# Patient Record
Sex: Male | Born: 1990 | Race: Black or African American | Hispanic: No | Marital: Single | Smoking: Never smoker
Health system: Southern US, Community
[De-identification: ages and names within clinical notes are randomized; demographics above are authoritative.]

## PROBLEM LIST (undated history)

## (undated) HISTORY — PX: LEG SURGERY: SHX1003

---
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
## 2013-01-21 ENCOUNTER — Emergency Department (HOSPITAL_COMMUNITY)
Admission: EM | Admit: 2013-01-21 | Discharge: 2013-01-21 | Disposition: A | Discharge status: Home / Self Care | Payer: Medicaid Other | Attending: Emergency Medicine | Admitting: Emergency Medicine

## 2013-01-21 ENCOUNTER — Emergency Department (HOSPITAL_COMMUNITY): Payer: Medicaid Other

## 2013-01-21 ENCOUNTER — Encounter (HOSPITAL_COMMUNITY): Payer: Self-pay | Admitting: Emergency Medicine

## 2013-01-21 DIAGNOSIS — J45909 Unspecified asthma, uncomplicated: Secondary | ICD-10-CM | POA: Insufficient documentation

## 2013-01-21 DIAGNOSIS — R05 Cough: Secondary | ICD-10-CM | POA: Insufficient documentation

## 2013-01-21 DIAGNOSIS — R059 Cough, unspecified: Secondary | ICD-10-CM

## 2013-01-21 DIAGNOSIS — F17200 Nicotine dependence, unspecified, uncomplicated: Secondary | ICD-10-CM | POA: Insufficient documentation

## 2013-01-21 DIAGNOSIS — M79642 Pain in left hand: Secondary | ICD-10-CM

## 2013-01-21 DIAGNOSIS — M79609 Pain in unspecified limb: Secondary | ICD-10-CM | POA: Insufficient documentation

## 2013-01-21 MED ORDER — ALBUTEROL SULFATE HFA 108 (90 BASE) MCG/ACT IN AERS
2.0000 | INHALATION_SPRAY | RESPIRATORY_TRACT | Status: DC | PRN
  Administered 2013-01-21: 2 via RESPIRATORY_TRACT
  Filled 2013-01-21 (×2): qty 6.7

## 2013-01-21 MED ORDER — GUAIFENESIN 100 MG/5ML PO LIQD: 100.0000 mg | ORAL | Status: DC | PRN

## 2013-01-21 NOTE — ED Provider Notes (Signed)
CSN: [id number]     Arrival date & time 01/21/13  1544 History   First MD Initiated Contact with Patient 01/21/13 1633     Chief Complaint  Patient presents with  . Cough   (Consider location/radiation/quality/duration/timing/severity/associated sxs/prior Treatment) HPI Comments: Patient presents emergency department with chief complaint of cough x3 months. He states that for the past couple of days, he has had worsening pain with inspiration. He denies any fevers, night sweats, or vomiting. Denies any sick contacts. He has not tried taking anything for the cough. He endorses a history of asthma. Denies any recent surgery or travel. Additionally, patient states that he has had left hand pain x2 weeks. States the pain is worsened when he holds his hand in a certain position, and when he is texting or playing video games.  The history is provided by the patient. No language interpreter was used.    Past Medical History  Diagnosis Date  . Asthma    Past Surgical History  Procedure Laterality Date  . Leg surgery    . Hardware removal  08/13/2011    Procedure: HARDWARE REMOVAL;  Surgeon: Kathryne Hitch, MD;  Location: [facility] OR;  Service: Orthopedics;  Laterality: Right;  Removal of distal interlocking screws right tibial nail   History reviewed. No pertinent family history. History  Substance Use Topics  . Smoking status: Current Every Day Smoker  . Smokeless tobacco: Not on file  . Alcohol Use: No    Review of Systems  All other systems reviewed and are negative.    Allergies  Review of patient's allergies indicates no known allergies.  Home Medications  No current outpatient prescriptions on file. BP 116/52  Pulse 78  Temp(Src) 98.2 F (36.8 C) (Oral)  Resp 18  SpO2 98% Physical Exam  Nursing note and vitals reviewed. Constitutional: He is oriented to person, place, and time. He appears well-developed and well-nourished.  HENT:  Head: Normocephalic and atraumatic.   Eyes: EOM are normal.  Neck: Normal range of motion. Neck supple.  Cardiovascular: Normal rate, regular rhythm, normal heart sounds and intact distal pulses.  Exam reveals no gallop and no friction rub.   No murmur heard. Pulmonary/Chest: Effort normal and breath sounds normal. No respiratory distress. He has no wheezes. He has no rales. He exhibits no tenderness.  Abdominal: Soft. He exhibits no distension.  Musculoskeletal: Normal range of motion. He exhibits no edema and no tenderness.  Left hand range of motion strength 5/5, negative Tinel, negative Phalen, no bony tenderness of the left hand or wrist  Neurological: He is alert and oriented to person, place, and time.  Sensation and strength intact throughout  Skin: Skin is warm and dry.  No erythema, or signs of cellulitis  Psychiatric: He has a normal mood and affect. His behavior is normal. Judgment and thought content normal.    ED Course  Procedures (including critical care time) Labs Review Labs Reviewed - No data to display Imaging Review Dg Chest 2 View  01/21/2013   CLINICAL DATA:  Coughing  EXAM: CHEST  2 VIEW  COMPARISON:  None.  FINDINGS: Lungs are clear. Heart size and pulmonary vascularity are normal. No adenopathy. No bone lesions.  IMPRESSION: No abnormality noted.   Electronically Signed   By: Bretta Bang M.D.   On: 01/21/2013 16:15    EKG Interpretation   None       MDM   1. Cough   2. Hand pain, left  Patient with cough x3 months. No fever, no hemoptysis, no night sweats. Pain is worsened with inspiration which is new. Chest x-ray is negative. PERC negative. Low risk for ACS. Patient is well-appearing, not in any apparent distress. Suspect intercostal strain. Recommend ibuprofen, and will give cough medicine.  Patient also has left hand pain, however no bony tenderness, no signs of infection, normal range of motion and strength, sensation intact, and good pulses. Given patient a wrist brace,  advised him against doing activity that hurts him, recommend him followup if symptoms do not improve in one week.   Roxy Horseman, PA-C 01/21/13 [phone]

## 2013-01-21 NOTE — ED Provider Notes (Signed)
  Medical screening examination/treatment/procedure(s) were performed by non-physician practitioner and as supervising physician I was immediately available for consultation/collaboration.  EKG Interpretation   None          Gerhard Munch, MD 01/21/13 1930

## 2013-01-21 NOTE — ED Notes (Signed)
Pt c/o cough x 3 months and pain with cough and inspiration x 1 week; pt sts when he holds his cell phone he has pain in left hand x 2 weeks

## 2014-02-27 ENCOUNTER — Emergency Department (HOSPITAL_COMMUNITY)
Admission: EM | Admit: 2014-02-27 | Discharge: 2014-02-27 | Disposition: A | Discharge status: Home / Self Care | Payer: Self-pay | Attending: Emergency Medicine | Admitting: Emergency Medicine

## 2014-02-27 ENCOUNTER — Encounter (HOSPITAL_COMMUNITY): Payer: Self-pay | Admitting: *Deleted

## 2014-02-27 DIAGNOSIS — K122 Cellulitis and abscess of mouth: Secondary | ICD-10-CM

## 2014-02-27 MED ORDER — IBUPROFEN 800 MG PO TABS: 800.0000 mg | ORAL_TABLET | Freq: Three times a day (TID) | ORAL | Status: DC | PRN

## 2014-02-27 MED ORDER — KETOROLAC TROMETHAMINE 30 MG/ML IJ SOLN
30.0000 mg | Freq: Once | INTRAMUSCULAR | Status: AC
  Administered 2014-02-27: 30 mg via INTRAVENOUS
  Filled 2014-02-27: qty 1

## 2014-02-27 MED ORDER — SODIUM CHLORIDE 0.9 % IV BOLUS (SEPSIS)
1000.0000 mL | Freq: Once | INTRAVENOUS | Status: AC
  Administered 2014-02-27: 1000 mL via INTRAVENOUS

## 2014-02-27 MED ORDER — SODIUM CHLORIDE 0.9 % IV SOLN
3.0000 g | Freq: Once | INTRAVENOUS | Status: AC
  Administered 2014-02-27: 3 g via INTRAVENOUS
  Filled 2014-02-27: qty 3

## 2014-02-27 MED ORDER — AMOXICILLIN-POT CLAVULANATE 875-125 MG PO TABS: 1.0000 | ORAL_TABLET | Freq: Two times a day (BID) | ORAL | Status: DC

## 2014-02-27 MED ORDER — DEXAMETHASONE SODIUM PHOSPHATE 10 MG/ML IJ SOLN
10.0000 mg | Freq: Once | INTRAMUSCULAR | Status: AC
  Administered 2014-02-27: 10 mg via INTRAVENOUS
  Filled 2014-02-27: qty 1

## 2014-02-27 MED ORDER — METHYLPREDNISOLONE (PAK) 4 MG PO TABS: ORAL_TABLET | ORAL | Status: DC

## 2014-02-27 NOTE — Discharge Instructions (Signed)
Uvulitis  Uvulitis is redness and soreness (inflammation) of the uvula. The uvula is the small tongue-shaped piece of tissue in the back of your mouth.   CAUSES  Infection is a common cause of uvulitis. Infection of the uvula can be either viral or bacterial. Infectious uvulitis usually only occurs in association with another condition, such as inflammation and infection of the mouth or throat.   Other causes of uvulitis include:  · Trauma to the uvula.  · Swelling from excess fluid buildup (edema), which may be an allergic reaction.  · Inhalation of irritants, such as chemical agents, smoke, or steam.  DIAGNOSIS  Your caregiver can usually diagnose uvulitis through a physical examination. Bacterial uvulitis can be diagnosed through the results of the growth of samples of bodily substances taken from your mouth (cultures).  HOME CARE INSTRUCTIONS   · Rest as much as possible.  · Young children may suck on frozen juice bars or frozen ice pops. Older children and adults may gargle with a warm or cold liquid to help soothe the throat. (Mix ¼ tsp of salt in 8 oz of water, or use strong tea.)  · Use a cool-mist humidifier to lessen throat irritation and cough.  · Drink enough fluids to keep your urine clear or pale yellow.  · While the throat is very sore, eat soft or liquid foods such as milk, ice cream, soups, or milk drinks.  · Family members who develop a sore throat or fever should have a medical exam or throat culture.  · If your child has uvulitis and is taking antibiotic medicine, wait 24 hours or until his or her temperature is near normal (less than 100° F [37.8° C]) before allowing him or her to return to school or day care.  · Only take over-the-counter or prescription medicines for pain, discomfort, or fever as directed by your caregiver.  Ask when your test results will be ready. Make sure you get your test results.   SEEK MEDICAL CARE IF:   · You have an oral temperature above 102° F (38.9° C).  · You  develop large, tender lumps your the neck.  · Your child develops a rash.  · You cough up green, yellow-brown, or bloody substances.  SEEK IMMEDIATE MEDICAL CARE IF:   · You develop any new symptoms, such as vomiting, earache, severe headache, stiff neck, chest pain, or trouble breathing or swallowing.  · Your airway is blocked.  · You develop more severe throat pain along with drooling or voice changes.  Document Released: 09/08/2003 Document Revised: 04/22/2011 Document Reviewed: 04/05/2010  [employer]® Patient Information ©2015 [employer]. This information is not intended to replace advice given to you by your health care provider. Make sure you discuss any questions you have with your health care provider.

## 2014-02-27 NOTE — ED Notes (Signed)
Awoke feeling like something in his throat was swollen. Throat is uncomfortable. No history of anaphylaxis

## 2014-02-27 NOTE — ED Provider Notes (Signed)
TIME SEEN: 8:50 AM  CHIEF COMPLAINT: Swollen uvula  HPI: Pt is a 24 y.o. male who presents emergency department with a swollen uvula when he woke up this morning. Denies that his throat is hurting him. Denies any fevers or chills. No difficulty swallowing, speaking or breathing. He states he just feels uncomfortable when he breathes or his mouth because he can feel his uvula moving. No history of any new exposures, new foods or medications. No tongue or lip swelling. No dental pain. No sick contacts.  ROS: See HPI Constitutional: no fever  Eyes: no drainage  ENT: no runny nose   Cardiovascular:  no chest pain  Resp: no SOB  GI: no vomiting GU: no dysuria Integumentary: no rash  Allergy: no hives  Musculoskeletal: no leg swelling  Neurological: no slurred speech ROS otherwise negative  PAST MEDICAL HISTORY/PAST SURGICAL HISTORY:  No past medical history on file.  MEDICATIONS:  Prior to Admission medications   Not on File    ALLERGIES:  Allergies not on file  SOCIAL HISTORY:  History  Substance Use Topics  . Smoking status: Not on file  . Smokeless tobacco: Not on file  . Alcohol Use: Not on file    FAMILY HISTORY: No family history on file.  EXAM: BP 135/71 mmHg  Pulse 96  Temp(Src) 98.6 F (37 C) (Oral)  Resp 20  SpO2 99% CONSTITUTIONAL: Alert and oriented and responds appropriately to questions. Well-appearing; well-nourished, nontoxic-appearing HEAD: Normocephalic EYES: Conjunctivae clear, PERRL ENT: normal nose; no rhinorrhea; moist mucous membranes; no dental caries are abscess, no oral pharyngeal erythema, no tonsillar hypertrophy or exudate, tonsils are extremely small, patient does have use either swelling but has a very wide and open and patent airway, no trismus or drooling, no muffled voice, normal phonation, no cervical lymphadenopathy, normal range of motion in the neck, no uvular deviation NECK: Supple, no meningismus, no LAD  CARD: RRR; S1 and S2  appreciated; no murmurs, no clicks, no rubs, no gallops RESP: Normal chest excursion without splinting or tachypnea; breath sounds clear and equal bilaterally; no wheezes, no rhonchi, no rales,  ABD/GI: Normal bowel sounds; non-distended; soft, non-tender, no rebound, no guarding BACK:  The back appears normal and is non-tender to palpation, there is no CVA tenderness EXT: Normal ROM in all joints; non-tender to palpation; no edema; normal capillary refill; no cyanosis    SKIN: Normal color for age and race; warm NEURO: Moves all extremities equally PSYCH: The patient's mood and manner are appropriate. Grooming and personal hygiene are appropriate.  MEDICAL DECISION MAKING: Pt here with what appears to be isolated uvulitis. There is no sign of tonsillar hypertrophy or exudate, no uvular deviation.  He has normal range of motion in his neck and no neck pain to suggest a deep space neck infection. He is afebrile. No stridor, difficulty swallowing or speaking to suggest epiglottitis. He is nontoxic appearing and in no respiratory distress. He has a normal voice. Will give Unasyn, Decadron, Toradol and IV fluids. Anticipate discharge home on Augmentin, ibuprofen.  ED PROGRESS: 10:30 AM Pt reports some improvement of symptoms. He is still not having any respiratory distress is swallowing his secretions. We'll discharge home on Augmentin with Medrol Dosepak and pain medication. Discussed return precautions including difficulty swallowing, speaking or breathing. He verbalizes understanding and is comfortable with plan. We'll give ENT follow-up information as needed if symptoms are not improved in 1-2 weeks.     Layla MawKristen N Davit Vassar, DO 02/27/14 1042

## 2014-02-27 NOTE — ED Notes (Signed)
Pt reports feels swelling is 'a little better'

## 2014-02-27 NOTE — ED Notes (Signed)
Bed: WA21 Expected date: 02/27/14 Expected time: 8:36 AM Means of arrival:  Comments: EMs

## 2014-10-30 ENCOUNTER — Emergency Department (HOSPITAL_COMMUNITY)
Admission: EM | Admit: 2014-10-30 | Discharge: 2014-10-30 | Discharge status: Against Medical Advice | Payer: Medicaid Other | Attending: Emergency Medicine | Admitting: Emergency Medicine

## 2014-10-30 ENCOUNTER — Encounter (HOSPITAL_COMMUNITY): Payer: Self-pay | Admitting: Emergency Medicine

## 2014-10-30 DIAGNOSIS — J45901 Unspecified asthma with (acute) exacerbation: Secondary | ICD-10-CM | POA: Insufficient documentation

## 2014-10-30 LAB — CBC
HCT: 41.3 % (ref 39.0–52.0)
Hemoglobin: 14.2 g/dL (ref 13.0–17.0)
MCH: 29.3 pg (ref 26.0–34.0)
MCHC: 34.4 g/dL (ref 30.0–36.0)
MCV: 85.3 fL (ref 78.0–100.0)
PLATELETS: 169 10*3/uL (ref 150–400)
RBC: 4.84 MIL/uL (ref 4.22–5.81)
RDW: 12.5 % (ref 11.5–15.5)
WBC: 6.1 10*3/uL (ref 4.0–10.5)

## 2014-10-30 LAB — BASIC METABOLIC PANEL
Anion gap: 8 (ref 5–15)
BUN: 10 mg/dL (ref 6–20)
CALCIUM: 9.2 mg/dL (ref 8.9–10.3)
CO2: 23 mmol/L (ref 22–32)
CREATININE: 0.97 mg/dL (ref 0.61–1.24)
Chloride: 106 mmol/L (ref 101–111)
GFR calc non Af Amer: >60 mL/min (ref >60)
Glucose, Bld: 100 mg/dL — ABNORMAL HIGH (ref 65–99)
Potassium: 3.7 mmol/L (ref 3.5–5.1)
SODIUM: 137 mmol/L (ref 135–145)

## 2014-10-30 LAB — I-STAT TROPONIN, ED: TROPONIN I, POC: 0 ng/mL (ref 0.00–0.08)

## 2014-10-30 NOTE — ED Notes (Signed)
Pt called again for room- not in

## 2014-10-30 NOTE — ED Notes (Signed)
Pt called for room assignment- no response. Pt is not in waiting room or radiology

## 2014-10-30 NOTE — ED Notes (Signed)
Pt reports sob since yesterday with episodes of palpitations. sts when he was younger he had to use albuterol inhaler. Lung sounds clear.

## 2014-10-31 ENCOUNTER — Emergency Department (HOSPITAL_COMMUNITY): Payer: Medicaid Other

## 2014-10-31 ENCOUNTER — Emergency Department (HOSPITAL_COMMUNITY)
Admission: EM | Admit: 2014-10-31 | Discharge: 2014-11-01 | Disposition: A | Discharge status: Home / Self Care | Payer: Medicaid Other | Attending: Emergency Medicine | Admitting: Emergency Medicine

## 2014-10-31 ENCOUNTER — Encounter (HOSPITAL_COMMUNITY): Payer: Self-pay | Admitting: Emergency Medicine

## 2014-10-31 DIAGNOSIS — M546 Pain in thoracic spine: Secondary | ICD-10-CM | POA: Insufficient documentation

## 2014-10-31 DIAGNOSIS — Z79899 Other long term (current) drug therapy: Secondary | ICD-10-CM | POA: Insufficient documentation

## 2014-10-31 DIAGNOSIS — Z87891 Personal history of nicotine dependence: Secondary | ICD-10-CM | POA: Insufficient documentation

## 2014-10-31 DIAGNOSIS — J45901 Unspecified asthma with (acute) exacerbation: Secondary | ICD-10-CM | POA: Insufficient documentation

## 2014-10-31 DIAGNOSIS — R0602 Shortness of breath: Secondary | ICD-10-CM

## 2014-10-31 LAB — CBC
HEMATOCRIT: 41.8 % (ref 39.0–52.0)
HEMOGLOBIN: 14.7 g/dL (ref 13.0–17.0)
MCH: 29.7 pg (ref 26.0–34.0)
MCHC: 35.2 g/dL (ref 30.0–36.0)
MCV: 84.4 fL (ref 78.0–100.0)
Platelets: 185 10*3/uL (ref 150–400)
RBC: 4.95 MIL/uL (ref 4.22–5.81)
RDW: 12.3 % (ref 11.5–15.5)
WBC: 7.1 10*3/uL (ref 4.0–10.5)

## 2014-10-31 LAB — BASIC METABOLIC PANEL
ANION GAP: 12 (ref 5–15)
BUN: 15 mg/dL (ref 6–20)
CO2: 22 mmol/L (ref 22–32)
Calcium: 9.5 mg/dL (ref 8.9–10.3)
Chloride: 103 mmol/L (ref 101–111)
Creatinine, Ser: 1.17 mg/dL (ref 0.61–1.24)
GLUCOSE: 102 mg/dL — AB (ref 65–99)
POTASSIUM: 3.3 mmol/L — AB (ref 3.5–5.1)
Sodium: 137 mmol/L (ref 135–145)

## 2014-10-31 LAB — I-STAT TROPONIN, ED: Troponin i, poc: 0.01 ng/mL (ref 0.00–0.08)

## 2014-10-31 MED ORDER — KETOROLAC TROMETHAMINE 30 MG/ML IJ SOLN
30.0000 mg | Freq: Once | INTRAMUSCULAR | Status: AC
  Administered 2014-11-01: 30 mg via INTRAVENOUS
  Filled 2014-10-31: qty 1

## 2014-10-31 NOTE — ED Notes (Signed)
Pt from home for eval of sob that began last night, pt states "I feel like even with a deep breath I can't get enough air in." pt denies any pain with inspiration. Pt also reports right back pain that gets worse with movement. Also reports some dizziness, pt anxious in triage.

## 2014-10-31 NOTE — ED Provider Notes (Signed)
CSN: [id number]   Arrival date & time 10/31/14 2058  History  This chart was scribed for Shon Baton, MD by Bethel Born, ED Scribe. This patient was seen in room D35C/D35C and the patient's care was started at 11:23 PM.  Chief Complaint  Patient presents with  . Shortness of Breath  . Back Pain    HPI The history is provided by the patient. No language interpreter was used.   Micheal Garrison is a 24 y.o. male with PMHx of DVT in 2011 who presents to the Emergency Department complaining of constant SOB with onset yesterday. He states that he is unable to take a "good deep breath".  The SOB is worse with exertion and better with rest. Associated symptoms include right mid back pain that is exacerbated by movement. He believes that he pulled a muscle stretching yesterday. Pt denies fever, cough, chest pain, nausea, vomiting, diarrhea, and LE swelling. No tobacco use.  Reports history of DVT in the setting of trauma. He is not currently on an anticoagulant.   Past Medical History  Diagnosis Date  . Asthma     Past Surgical History  Procedure Laterality Date  . Leg surgery    . Hardware removal  08/13/2011    Procedure: HARDWARE REMOVAL;  Surgeon: Kathryne Hitch, MD;  Location: [facility] OR;  Service: Orthopedics;  Laterality: Right;  Removal of distal interlocking screws right tibial nail    No family history on file.  Social History  Substance Use Topics  . Smoking status: Former Games developer  . Smokeless tobacco: None  . Alcohol Use: No     Review of Systems  Constitutional: Negative.  Negative for fever.  Respiratory: Positive for shortness of breath. Negative for cough and chest tightness.   Cardiovascular: Negative.  Negative for chest pain.  Gastrointestinal: Negative.  Negative for abdominal pain.  Genitourinary: Negative.  Negative for dysuria.  Musculoskeletal: Positive for back pain.  All other systems reviewed and are negative.  Home Medications   Prior to  Admission medications   Medication Sig Start Date End Date Taking? Authorizing Provider  albuterol (PROVENTIL HFA;VENTOLIN HFA) 108 (90 BASE) MCG/ACT inhaler Inhale 2 puffs into the lungs every 6 (six) hours as needed for wheezing or shortness of breath. 11/01/14   Shon Baton, MD  ibuprofen (ADVIL,MOTRIN) 600 MG tablet Take 1 tablet (600 mg total) by mouth every 6 (six) hours as needed. 11/01/14   Shon Baton, MD    Allergies  Review of patient's allergies indicates no known allergies.  Triage Vitals: BP 122/58 mmHg  Pulse 64  Temp(Src) 98 F (36.7 C) (Oral)  Resp 18  Ht 6' (1.829 m)  Wt 250 lb (113.399 kg)  BMI 33.90 kg/m2  SpO2 98%  Physical Exam  Constitutional: He is oriented to person, place, and time. He appears well-developed and well-nourished. No distress.  HENT:  Head: Normocephalic and atraumatic.  Cardiovascular: Normal rate, regular rhythm and normal heart sounds.   No murmur heard. Pulmonary/Chest: Effort normal and breath sounds normal. No respiratory distress. He has no wheezes.  Abdominal: Soft. Bowel sounds are normal. There is no tenderness. There is no rebound.  Musculoskeletal: He exhibits no edema.  Tenderness to palpation over the mid to right back without crepitus  Neurological: He is alert and oriented to person, place, and time.  Skin: Skin is warm and dry.  Psychiatric: He has a normal mood and affect.  Nursing note and vitals reviewed.   ED  Course  Procedures   DIAGNOSTIC STUDIES: Oxygen Saturation is 98% on RA, normal by my interpretation.    COORDINATION OF CARE: 11:26 PM Discussed treatment plan which includes CXR, EKG, and labs with pt at bedside and pt agreed to plan.  Labs Reviewed  BASIC METABOLIC PANEL - Abnormal; Notable for the following:    Potassium 3.3 (*)    Glucose, Bld 102 (*)    All other components within normal limits  CBC  D-DIMER, QUANTITATIVE (NOT AT [facility])  Rosezena Sensor, ED    Imaging Review Dg  Chest 2 View  10/31/2014   CLINICAL DATA:  Shortness of breath  EXAM: CHEST  2 VIEW  COMPARISON:  01/21/2013  FINDINGS: Lungs are clear.  No pleural effusion or pneumothorax.  The heart is normal in size.  Visualized osseous structures are within normal limits.  IMPRESSION: Normal chest radiographs.   Electronically Signed   By: Charline Bills M.D.   On: 10/31/2014 22:31    EKG Interpretation  Date/Time:  Monday October 31 2014 21:11:26 EDT Ventricular Rate:  94 PR Interval:  180 QRS Duration: 94 QT Interval:  344 QTC Calculation: 430 R Axis:   80 Text Interpretation:  Normal sinus rhythm Normal ECG No significant change since last tracing Confirmed by HORTON  MD, Toni Amend ([id number]) on 10/31/2014 11:10:06 PM    MDM   Final diagnoses:  Shortness of breath  Midline thoracic back pain    Patient presents with shortness of breath. Nontoxic on exam. Afebrile. Satting 96% on room air. Physical exam is largely reassuring. He is not wheezing. Does have a history of "exertional asthma." Reproducible back pain following a muscle strain. Patient was given Toradol. Workup including troponin, EKG, chest x-ray, and d-dimer is all reassuring. Patient was given an HFA inhaler to trial for symptomatic relief. Discussed workup with the patient. He was given strict return precautions.  After history, exam, and medical workup I feel the patient has been appropriately medically screened and is safe for discharge home. Pertinent diagnoses were discussed with the patient. Patient was given return precautions.  I personally performed the services described in this documentation, which was scribed in my presence. The recorded information has been reviewed and is accurate.    Shon Baton, MD 11/01/14 [phone]

## 2014-11-01 LAB — D-DIMER, QUANTITATIVE (NOT AT ARMC)

## 2014-11-01 MED ORDER — ALBUTEROL SULFATE HFA 108 (90 BASE) MCG/ACT IN AERS
2.0000 | INHALATION_SPRAY | Freq: Four times a day (QID) | RESPIRATORY_TRACT | Status: DC | PRN
  Administered 2014-11-01: 2 via RESPIRATORY_TRACT
  Filled 2014-11-01: qty 6.7

## 2014-11-01 MED ORDER — ALBUTEROL SULFATE HFA 108 (90 BASE) MCG/ACT IN AERS: 2.0000 | INHALATION_SPRAY | Freq: Four times a day (QID) | RESPIRATORY_TRACT | Status: DC | PRN

## 2014-11-01 MED ORDER — IBUPROFEN 600 MG PO TABS: 600.0000 mg | ORAL_TABLET | Freq: Four times a day (QID) | ORAL | Status: DC | PRN

## 2014-11-01 NOTE — Discharge Instructions (Signed)
You were seen today for shortness of breath. Your workup is reassuring. Your heart tests and screening for blood clots are negative. Given your history of asthma, I would trial using an inhaler to see if that helps with your symptoms.  Shortness of Breath Shortness of breath means you have trouble breathing. It could also mean that you have a medical problem. You should get immediate medical care for shortness of breath. CAUSES   Not enough oxygen in the air such as with high altitudes or a smoke-filled room.  Certain lung diseases, infections, or problems.  Heart disease or conditions, such as angina or heart failure.  Low red blood cells (anemia).  Poor physical fitness, which can cause shortness of breath when you exercise.  Chest or back injuries or stiffness.  Being overweight.  Smoking.  Anxiety, which can make you feel like you are not getting enough air. DIAGNOSIS  Serious medical problems can often be found during your physical exam. Tests may also be done to determine why you are having shortness of breath. Tests may include:  Chest X-rays.  Lung function tests.  Blood tests.  An electrocardiogram (ECG).  An ambulatory electrocardiogram. An ambulatory ECG records your heartbeat patterns over a 24-hour period.  Exercise testing.  A transthoracic echocardiogram (TTE). During echocardiography, sound waves are used to evaluate how blood flows through your heart.  A transesophageal echocardiogram (TEE).  Imaging scans. Your health care provider may not be able to find a cause for your shortness of breath after your exam. In this case, it is important to have a follow-up exam with your health care provider as directed.  TREATMENT  Treatment for shortness of breath depends on the cause of your symptoms and can vary greatly. HOME CARE INSTRUCTIONS   Do not smoke. Smoking is a common cause of shortness of breath. If you smoke, ask for help to quit.  Avoid being  around chemicals or things that may bother your breathing, such as paint fumes and dust.  Rest as needed. Slowly resume your usual activities.  If medicines were prescribed, take them as directed for the full length of time directed. This includes oxygen and any inhaled medicines.  Keep all follow-up appointments as directed by your health care provider. SEEK MEDICAL CARE IF:   Your condition does not improve in the time expected.  You have a hard time doing your normal activities even with rest.  You have any new symptoms. SEEK IMMEDIATE MEDICAL CARE IF:   Your shortness of breath gets worse.  You feel light-headed, faint, or develop a cough not controlled with medicines.  You start coughing up blood.  You have pain with breathing.  You have chest pain or pain in your arms, shoulders, or abdomen.  You have a fever.  You are unable to walk up stairs or exercise the way you normally do. MAKE SURE YOU:  Understand these instructions.  Will watch your condition.  Will get help right away if you are not doing well or get worse. Document Released: 10/23/2000 Document Revised: 02/02/2013 Document Reviewed: 04/15/2011 [facility] Patient Information 2015 [place], [place]. This information is not intended to replace advice given to you by your health care provider. Make sure you discuss any questions you have with your health care provider.

## 2014-11-02 ENCOUNTER — Encounter (HOSPITAL_COMMUNITY): Payer: Self-pay | Admitting: Emergency Medicine

## 2014-11-02 ENCOUNTER — Emergency Department (HOSPITAL_COMMUNITY)
Admission: EM | Admit: 2014-11-02 | Discharge: 2014-11-03 | Disposition: A | Discharge status: Home / Self Care | Payer: Medicaid Other | Attending: Emergency Medicine | Admitting: Emergency Medicine

## 2014-11-02 ENCOUNTER — Emergency Department (HOSPITAL_COMMUNITY): Payer: Medicaid Other

## 2014-11-02 DIAGNOSIS — Z87891 Personal history of nicotine dependence: Secondary | ICD-10-CM | POA: Insufficient documentation

## 2014-11-02 DIAGNOSIS — R002 Palpitations: Secondary | ICD-10-CM

## 2014-11-02 DIAGNOSIS — R350 Frequency of micturition: Secondary | ICD-10-CM | POA: Insufficient documentation

## 2014-11-02 DIAGNOSIS — R220 Localized swelling, mass and lump, head: Secondary | ICD-10-CM | POA: Insufficient documentation

## 2014-11-02 DIAGNOSIS — R079 Chest pain, unspecified: Secondary | ICD-10-CM | POA: Insufficient documentation

## 2014-11-02 DIAGNOSIS — R202 Paresthesia of skin: Secondary | ICD-10-CM | POA: Insufficient documentation

## 2014-11-02 DIAGNOSIS — J45901 Unspecified asthma with (acute) exacerbation: Secondary | ICD-10-CM | POA: Insufficient documentation

## 2014-11-02 DIAGNOSIS — M545 Low back pain: Secondary | ICD-10-CM | POA: Insufficient documentation

## 2014-11-02 DIAGNOSIS — Z79899 Other long term (current) drug therapy: Secondary | ICD-10-CM | POA: Insufficient documentation

## 2014-11-02 LAB — COMPREHENSIVE METABOLIC PANEL
ALT: 22 U/L (ref 17–63)
ANION GAP: 9 (ref 5–15)
AST: 23 U/L (ref 15–41)
Albumin: 4.4 g/dL (ref 3.5–5.0)
Alkaline Phosphatase: 67 U/L (ref 38–126)
BUN: 12 mg/dL (ref 6–20)
CHLORIDE: 108 mmol/L (ref 101–111)
CO2: 25 mmol/L (ref 22–32)
Calcium: 9.6 mg/dL (ref 8.9–10.3)
Creatinine, Ser: 1.19 mg/dL (ref 0.61–1.24)
GFR calc non Af Amer: >60 mL/min (ref >60)
Glucose, Bld: 98 mg/dL (ref 65–99)
POTASSIUM: 3.4 mmol/L — AB (ref 3.5–5.1)
SODIUM: 142 mmol/L (ref 135–145)
Total Bilirubin: 0.9 mg/dL (ref 0.3–1.2)
Total Protein: 7.3 g/dL (ref 6.5–8.1)

## 2014-11-02 LAB — CBC WITH DIFFERENTIAL/PLATELET
BASOS PCT: 0 %
Basophils Absolute: 0 10*3/uL (ref 0.0–0.1)
EOS ABS: 0.1 10*3/uL (ref 0.0–0.7)
EOS PCT: 2 %
HCT: 42 % (ref 39.0–52.0)
Hemoglobin: 14.5 g/dL (ref 13.0–17.0)
LYMPHS ABS: 2.2 10*3/uL (ref 0.7–4.0)
Lymphocytes Relative: 37 %
MCH: 29.3 pg (ref 26.0–34.0)
MCHC: 34.5 g/dL (ref 30.0–36.0)
MCV: 84.8 fL (ref 78.0–100.0)
MONOS PCT: 6 %
Monocytes Absolute: 0.4 10*3/uL (ref 0.1–1.0)
Neutro Abs: 3.3 10*3/uL (ref 1.7–7.7)
Neutrophils Relative %: 55 %
PLATELETS: 191 10*3/uL (ref 150–400)
RBC: 4.95 MIL/uL (ref 4.22–5.81)
RDW: 12.6 % (ref 11.5–15.5)
WBC: 6.1 10*3/uL (ref 4.0–10.5)

## 2014-11-02 LAB — URINALYSIS, ROUTINE W REFLEX MICROSCOPIC
Glucose, UA: NEGATIVE mg/dL
HGB URINE DIPSTICK: NEGATIVE
KETONES UR: 15 mg/dL — AB
Leukocytes, UA: NEGATIVE
Nitrite: NEGATIVE
PH: 5.5 (ref 5.0–8.0)
Protein, ur: NEGATIVE mg/dL
SPECIFIC GRAVITY, URINE: 1.029 (ref 1.005–1.030)
UROBILINOGEN UA: 1 mg/dL (ref 0.0–1.0)

## 2014-11-02 MED ORDER — SODIUM CHLORIDE 0.9 % IV BOLUS (SEPSIS)
1000.0000 mL | Freq: Once | INTRAVENOUS | Status: AC
  Administered 2014-11-03: 1000 mL via INTRAVENOUS

## 2014-11-02 NOTE — ED Provider Notes (Signed)
CSN: [id number]     Arrival date & time 11/02/14  2046 History  This chart was scribed for Gerhard Munch, MD by Freida Busman, ED Scribe. This patient was seen in room D32C/D32C and the patient's care was started 11:29 PM.    Chief Complaint  Patient presents with  . Back Pain  . Urinary Frequency  . Oral Swelling    The history is provided by the patient. No language interpreter was used.    HPI Comments:  Micheal Garrison is a 24 y.o. male who presents to the Emergency Department complaining of intermittent episodes of palpitations and SOB for ~4 days. He was seen in the ED on 9/18 for the same, as well as lower back pain. Pt states he still has lower back pain but it has improved since his visit. Pt also reports associated body shaking, tingling in his mouth, lower chest wall pain and decreased appetite that began after his visit. He notes normal fluid intake. Pt denies vision/speech changes, nausea, fever, sore throat, dysuria, penile pain, scrotal swelling, and unilateral weakness. Pt states he was last well on ~10/29/14. No alleviating factors noted. He also denies smoking, drinking, and illicit drug use.  Past Medical History  Diagnosis Date  . Asthma    Past Surgical History  Procedure Laterality Date  . Leg surgery    . Hardware removal  08/13/2011    Procedure: HARDWARE REMOVAL;  Surgeon: Kathryne Hitch, MD;  Location: [facility] OR;  Service: Orthopedics;  Laterality: Right;  Removal of distal interlocking screws right tibial nail   No family history on file. Social History  Substance Use Topics  . Smoking status: Former Games developer  . Smokeless tobacco: None  . Alcohol Use: No    Review of Systems  Constitutional:       Per HPI, otherwise negative  HENT:       Per HPI, otherwise negative  Respiratory:       Per HPI, otherwise negative  Cardiovascular:       Per HPI, otherwise negative  Gastrointestinal: Negative for vomiting.  Endocrine:       Negative aside from  HPI  Genitourinary:       Neg aside from HPI   Musculoskeletal:       Per HPI, otherwise negative  Skin: Negative.   Neurological: Negative for syncope.    Allergies  Review of patient's allergies indicates no known allergies.  Home Medications   Prior to Admission medications   Medication Sig Start Date End Date Taking? Authorizing Provider  albuterol (PROVENTIL HFA;VENTOLIN HFA) 108 (90 BASE) MCG/ACT inhaler Inhale 2 puffs into the lungs every 6 (six) hours as needed for wheezing or shortness of breath. 11/01/14  Yes Shon Baton, MD  ibuprofen (ADVIL,MOTRIN) 600 MG tablet Take 1 tablet (600 mg total) by mouth every 6 (six) hours as needed. 11/01/14  Yes Shon Baton, MD   BP 120/78 mmHg  Pulse 87  Temp(Src) 98.4 F (36.9 C) (Oral)  Resp 18  Ht 6' (1.829 m)  Wt 248 lb (112.492 kg)  BMI 33.63 kg/m2  SpO2 98% Physical Exam  Constitutional: He is oriented to person, place, and time. He appears well-developed. No distress.  HENT:  Head: Normocephalic and atraumatic.  Mouth/Throat: Oropharynx is clear and moist. No oropharyngeal exudate.  Eyes: Conjunctivae and EOM are normal.  Cardiovascular: Normal rate, regular rhythm and normal heart sounds.   Pulmonary/Chest: Effort normal and breath sounds normal. No stridor. No respiratory distress.  He exhibits no tenderness.  Abdominal: He exhibits no distension.  Musculoskeletal: He exhibits no edema.  Neurological: He is alert and oriented to person, place, and time.  Skin: Skin is warm and dry.  Psychiatric: He has a normal mood and affect.  Nursing note and vitals reviewed.   ED Course  Procedures   DIAGNOSTIC STUDIES:  Oxygen Saturation is 98% on RA, normal by my interpretation.    COORDINATION OF CARE:  11:39 PM Discussed treatment plan with pt at bedside and pt agreed to plan.  Labs Review Labs Reviewed  URINALYSIS, ROUTINE W REFLEX MICROSCOPIC (NOT AT [facility]) - Abnormal; Notable for the following:    Color,  Urine AMBER (*)    APPearance HAZY (*)    Bilirubin Urine SMALL (*)    Ketones, ur 15 (*)    All other components within normal limits  COMPREHENSIVE METABOLIC PANEL - Abnormal; Notable for the following:    Potassium 3.4 (*)    All other components within normal limits  CBC WITH DIFFERENTIAL/PLATELET  LIPASE, BLOOD  MONONUCLEOSIS SCREEN  CALCIUM, IONIZED    Chart review demonstrates presentation for similar complaints 2d ago.  Imaging Review Dg Chest 2 View  11/03/2014   CLINICAL DATA:  Chest pain and shortness of breath for 2 days, asthma  EXAM: CHEST  2 VIEW  COMPARISON:  10/30/2014  FINDINGS: Normal heart size, mediastinal contours, and pulmonary vascularity.  Lungs clear.  No pneumothorax.  Bones unremarkable.  IMPRESSION: Normal exam.   Electronically Signed   By: Ulyses Southward M.D.   On: 11/03/2014 00:36   I have personally reviewed and evaluated these images and lab results as part of my medical decision-making.   3:25 AM Following IVF, toradol, the patient appears better, states that he feels better.   MDM   I personally performed the services described in this documentation, which was scribed in my presence. The recorded information has been reviewed and is accurate.   Patient presents with multiple complaints. Here the patient is awake, alert, afebrile, hemodynamically stable, in no distress. Protonix, and his evaluation from 2 days ago are reassuring for low suspicion of ongoing acute infection, coronary ischemia, substantial electrolyte abnormalities. There is some evidence for dehydration, and the patient's improvement with IV fluids suggests ongoing process. Given his improvement, his reassuring findings, vitals, he was discharged with referral to our affiliated primary care center.  Gerhard Munch, MD 11/03/14 [phone]

## 2014-11-02 NOTE — ED Notes (Addendum)
Pt. presents with multiple complaints : low back pain , urinary frequency , " body shaking " ,  feels like tongue is swelling / mouth tingling , fatigue and poor appetite - all symptoms onset this week  . Denies fever or chills.  Airway intact with no swelling / respirations unlabored.

## 2014-11-03 ENCOUNTER — Encounter (HOSPITAL_COMMUNITY): Payer: Self-pay | Admitting: *Deleted

## 2014-11-03 ENCOUNTER — Emergency Department (HOSPITAL_COMMUNITY)
Admission: EM | Admit: 2014-11-03 | Discharge: 2014-11-03 | Discharge status: Against Medical Advice | Payer: Medicaid Other | Attending: Emergency Medicine | Admitting: Emergency Medicine

## 2014-11-03 DIAGNOSIS — J45909 Unspecified asthma, uncomplicated: Secondary | ICD-10-CM | POA: Insufficient documentation

## 2014-11-03 DIAGNOSIS — R531 Weakness: Secondary | ICD-10-CM | POA: Insufficient documentation

## 2014-11-03 LAB — BASIC METABOLIC PANEL
Anion gap: 9 (ref 5–15)
BUN: 11 mg/dL (ref 6–20)
CHLORIDE: 107 mmol/L (ref 101–111)
CO2: 22 mmol/L (ref 22–32)
CREATININE: 1.05 mg/dL (ref 0.61–1.24)
Calcium: 8.9 mg/dL (ref 8.9–10.3)
GFR calc Af Amer: >60 mL/min (ref >60)
GFR calc non Af Amer: >60 mL/min (ref >60)
Glucose, Bld: 106 mg/dL — ABNORMAL HIGH (ref 65–99)
Potassium: 3.8 mmol/L (ref 3.5–5.1)
Sodium: 138 mmol/L (ref 135–145)

## 2014-11-03 LAB — MONONUCLEOSIS SCREEN: Mono Screen: NEGATIVE

## 2014-11-03 LAB — CBC
HCT: 40.3 % (ref 39.0–52.0)
Hemoglobin: 13.9 g/dL (ref 13.0–17.0)
MCH: 29 pg (ref 26.0–34.0)
MCHC: 34.5 g/dL (ref 30.0–36.0)
MCV: 84.1 fL (ref 78.0–100.0)
PLATELETS: 167 10*3/uL (ref 150–400)
RBC: 4.79 MIL/uL (ref 4.22–5.81)
RDW: 12.4 % (ref 11.5–15.5)
WBC: 5.2 10*3/uL (ref 4.0–10.5)

## 2014-11-03 LAB — I-STAT TROPONIN, ED: Troponin i, poc: 0 ng/mL (ref 0.00–0.08)

## 2014-11-03 LAB — LIPASE, BLOOD: Lipase: 23 U/L (ref 22–51)

## 2014-11-03 NOTE — ED Notes (Signed)
Pt in c/o generalized fatigue and palpitations, recently seen for same, started again tonight at work, had near syncopal episode, no distress noted

## 2014-11-03 NOTE — ED Notes (Signed)
Pt did not answer for VS recheck and nurse first rounds.

## 2014-11-03 NOTE — Discharge Instructions (Signed)
As discussed, it is important that you follow up as soon as possible with your physician for continued management of your condition. ° °If you develop any new, or concerning changes in your condition, please return to the emergency department immediately. ° °

## 2014-11-03 NOTE — ED Notes (Signed)
Called in waiting room x1 but no answer

## 2014-11-04 ENCOUNTER — Encounter (HOSPITAL_COMMUNITY): Payer: Self-pay | Admitting: Emergency Medicine

## 2014-11-04 ENCOUNTER — Emergency Department (HOSPITAL_COMMUNITY)
Admission: EM | Admit: 2014-11-04 | Discharge: 2014-11-04 | Disposition: A | Discharge status: Home / Self Care | Payer: Medicaid Other | Attending: Emergency Medicine | Admitting: Emergency Medicine

## 2014-11-04 DIAGNOSIS — M545 Low back pain: Secondary | ICD-10-CM | POA: Insufficient documentation

## 2014-11-04 DIAGNOSIS — Z79899 Other long term (current) drug therapy: Secondary | ICD-10-CM | POA: Insufficient documentation

## 2014-11-04 DIAGNOSIS — Z87891 Personal history of nicotine dependence: Secondary | ICD-10-CM | POA: Insufficient documentation

## 2014-11-04 DIAGNOSIS — R079 Chest pain, unspecified: Secondary | ICD-10-CM | POA: Insufficient documentation

## 2014-11-04 DIAGNOSIS — J45909 Unspecified asthma, uncomplicated: Secondary | ICD-10-CM | POA: Insufficient documentation

## 2014-11-04 LAB — CALCIUM, IONIZED: CALCIUM, IONIZED, SERUM: 5 mg/dL (ref 4.5–5.6)

## 2014-11-04 NOTE — ED Notes (Signed)
Patient c/o mid upper chest pain, seen earlier this week for same complaints. Patient states pain occurs when he moves his arms, and when he is lifting things. Patient also c/o bilateral low back pain, pain is described as sharp. Patient denies both the chest pain and back pain when he is "not moving". Patient states he is taking motrin at home for pain, last dose last night, did not take this morning because "the pain was still there".

## 2014-11-04 NOTE — ED Notes (Signed)
MD at bedside. 

## 2014-11-04 NOTE — Discharge Instructions (Signed)

## 2014-11-05 NOTE — ED Provider Notes (Signed)
CSN: [id number]     Arrival date & time 11/04/14  4098 History   First MD Initiated Contact with Patient 11/04/14 [phone]     Chief Complaint  Patient presents with  . Chest Pain    upper-mid chest, when lifting things and moving arms  . Back Pain    bilateral lower back     (Consider location/radiation/quality/duration/timing/severity/associated sxs/prior Treatment) HPI Comments: 25yo M who p/w chest pain. He states that he has had central, non-radiating chest pain that is worse when he moves his arms and lifts things. Pain has been present for at least 5 days and he has been evaluated here twice for same pain. He states that today he is having the same pain, it just got worse last night and this morning. He took motrin last night but nothing today. He states that the pain is gone when he lays still. He also complains of low back pain that is occasionally sharp and severe across his low back. He has had this pain before and it is also worse with movement. He denies any cough/cold symptoms, fever, vomiting, abdominal pain. Mild shortness of breath only when pain is severe. No leg swelling or numbness, no saddle anesthesia, problems with urination, or incontinence.  Patient is a 24 y.o. male presenting with chest pain and back pain. The history is provided by the patient.  Chest Pain Associated symptoms: back pain   Back Pain Associated symptoms: chest pain     Past Medical History  Diagnosis Date  . Asthma    Past Surgical History  Procedure Laterality Date  . Leg surgery    . Hardware removal  08/13/2011    Procedure: HARDWARE REMOVAL;  Surgeon: Kathryne Hitch, MD;  Location: [facility] OR;  Service: Orthopedics;  Laterality: Right;  Removal of distal interlocking screws right tibial nail   History reviewed. No pertinent family history. Social History  Substance Use Topics  . Smoking status: Former Games developer  . Smokeless tobacco: None  . Alcohol Use: No    Review of Systems   Cardiovascular: Positive for chest pain.  Musculoskeletal: Positive for back pain.    10 Systems reviewed and are negative for acute change except as noted in the HPI.   Allergies  Review of patient's allergies indicates no known allergies.  Home Medications   Prior to Admission medications   Medication Sig Start Date End Date Taking? Authorizing Provider  albuterol (PROVENTIL HFA;VENTOLIN HFA) 108 (90 BASE) MCG/ACT inhaler Inhale 2 puffs into the lungs every 6 (six) hours as needed for wheezing or shortness of breath. 11/01/14  Yes Shon Baton, MD  ibuprofen (ADVIL,MOTRIN) 600 MG tablet Take 1 tablet (600 mg total) by mouth every 6 (six) hours as needed. Patient taking differently: Take 600 mg by mouth every 6 (six) hours as needed for moderate pain.  11/01/14  Yes Shon Baton, MD   BP 132/77 mmHg  Pulse 71  Temp(Src) 98.4 F (36.9 C) (Oral)  Resp 16  Ht 6' (1.829 m)  Wt 247 lb (112.038 kg)  BMI 33.49 kg/m2  SpO2 100% Physical Exam  Constitutional: He is oriented to person, place, and time. He appears well-developed and well-nourished. No distress.  HENT:  Head: Normocephalic and atraumatic.  Moist mucous membranes  Eyes: Conjunctivae are normal. Pupils are equal, round, and reactive to light.  Neck: Neck supple.  Cardiovascular: Normal rate, regular rhythm, normal heart sounds and intact distal pulses.   No murmur heard. Pulmonary/Chest: Effort normal and  breath sounds normal.  Tenderness with very light palpation of sternum/central chest, no rash  Abdominal: Soft. Bowel sounds are normal. He exhibits no distension. There is no tenderness.  Musculoskeletal: He exhibits no edema.  Tenderness to palpation of lumbar paraspinal muscles b/l, no midline spinal tenderness, 5/5 strength and normal sensation b/l LE  Neurological: He is alert and oriented to person, place, and time. He has normal reflexes. He exhibits normal muscle tone.  Fluent speech  Skin: Skin is  warm and dry. No rash noted.  Psychiatric: He has a normal mood and affect. Judgment normal.  Nursing note and vitals reviewed.   ED Course  Procedures (including critical care time) Labs Review Labs Reviewed - No data to display   EKG Interpretation   Date/Time:  Friday November 04 2014 09:10:24 EDT Ventricular Rate:  81 PR Interval:  179 QRS Duration: 96 QT Interval:  375 QTC Calculation: 435 R Axis:   46 Text Interpretation:  Sinus rhythm ST elev, probable normal early repol  pattern early repol pattern is similar to previous EKG, T wave inversion  in III similar to last EKG Confirmed by LITTLE MD, RACHEL ([id number]) on  11/04/2014 9:13:54 AM      MDM   Final diagnoses:  Chest pain, unspecified chest pain type   24yo M who p/w several days of central chest pain as well as low back pain that are brought on by movement and are minimal/resolved at rest. Pt well-appearing w/ normal VS at presentation. EKG shows benign early repol but otherwise normal. I reviewed the patient's work up from last night which included basic labs and chest XR which were reassuring. WBC count reassuring, troponin negative. Pt was also evaluated several days ago for same pain and had negative d-dimer at that time. No fevers or cauda equina sx to suggest acute spinal process. CP reproducible and given multiple negative work ups, very low suspicion for ACS and much more likely to be musculoskeletal. No FH of blood clots or early heart disease and no personal risk factors for blood clots. Instructed on supportive care including NSAIDs/tylenol and range of motion exercises. Reviewed return precautions. All questions answered. Pt discharged in satisfactory condition.    Laurence Spates, MD 11/05/14 [phone]

## 2014-11-26 ENCOUNTER — Encounter (HOSPITAL_COMMUNITY): Payer: Self-pay | Admitting: Emergency Medicine

## 2014-11-26 ENCOUNTER — Emergency Department (HOSPITAL_COMMUNITY)
Admission: EM | Admit: 2014-11-26 | Discharge: 2014-11-27 | Disposition: A | Discharge status: Home / Self Care | Payer: Self-pay | Attending: Emergency Medicine | Admitting: Emergency Medicine

## 2014-11-26 DIAGNOSIS — R05 Cough: Secondary | ICD-10-CM | POA: Insufficient documentation

## 2014-11-26 DIAGNOSIS — R42 Dizziness and giddiness: Secondary | ICD-10-CM | POA: Insufficient documentation

## 2014-11-26 DIAGNOSIS — J3489 Other specified disorders of nose and nasal sinuses: Secondary | ICD-10-CM | POA: Insufficient documentation

## 2014-11-26 DIAGNOSIS — Z79899 Other long term (current) drug therapy: Secondary | ICD-10-CM | POA: Insufficient documentation

## 2014-11-26 LAB — CBC
HEMATOCRIT: 39.7 % (ref 39.0–52.0)
HEMOGLOBIN: 13.5 g/dL (ref 13.0–17.0)
MCH: 29 pg (ref 26.0–34.0)
MCHC: 34 g/dL (ref 30.0–36.0)
MCV: 85.2 fL (ref 78.0–100.0)
Platelets: 148 10*3/uL — ABNORMAL LOW (ref 150–400)
RBC: 4.66 MIL/uL (ref 4.22–5.81)
RDW: 12.5 % (ref 11.5–15.5)
WBC: 6.1 10*3/uL (ref 4.0–10.5)

## 2014-11-26 LAB — BASIC METABOLIC PANEL
Anion gap: 5 (ref 5–15)
BUN: 14 mg/dL (ref 6–20)
CHLORIDE: 107 mmol/L (ref 101–111)
CO2: 29 mmol/L (ref 22–32)
Calcium: 9.2 mg/dL (ref 8.9–10.3)
Creatinine, Ser: 0.93 mg/dL (ref 0.61–1.24)
GFR calc Af Amer: >60 mL/min (ref >60)
GFR calc non Af Amer: >60 mL/min (ref >60)
Glucose, Bld: 92 mg/dL (ref 65–99)
Potassium: 4.4 mmol/L (ref 3.5–5.1)
Sodium: 141 mmol/L (ref 135–145)

## 2014-11-26 NOTE — ED Notes (Signed)
Pt arrived to the ED with a complaint of dizziness for 48 hours.  Pt denies LOC.  Pt states that he has been feeling tire.  Pt has a cough.  Pt also states his hands a have felt numb as well as having a tingling sensation.

## 2014-11-27 MED ORDER — CETIRIZINE HCL 10 MG PO CAPS: 10.0000 mg | ORAL_CAPSULE | Freq: Every day | ORAL | Status: DC

## 2014-11-27 MED ORDER — TRIAMCINOLONE ACETONIDE 55 MCG/ACT NA AERO: 2.0000 | INHALATION_SPRAY | Freq: Every day | NASAL | Status: DC

## 2014-11-27 NOTE — ED Provider Notes (Signed)
CSN: [id number]     Arrival date & time 11/26/14  2217 History   First MD Initiated Contact with Patient 11/26/14 2342     Chief Complaint  Patient presents with  . Dizziness     (Consider location/radiation/quality/duration/timing/severity/associated sxs/prior Treatment) Patient is a 24 y.o. male presenting with dizziness. The history is provided by the patient. No language interpreter was used.  Dizziness Quality:  Imbalance and lightheadedness Severity:  Moderate Onset quality:  Gradual Duration:  2 days Timing:  Intermittent Associated symptoms: no headaches, no nausea, no shortness of breath and no vomiting   Associated symptoms comment:  The patient describes dizzy/lightheadedness when he stands that persists through the day for the past 2 days. He denies headache, nausea, syncope, pain. He reports URI and cold symptoms 2 weeks ago and he has a persistent dry cough. When he has dizziness, he states his hand tingle bilaterally. No weakness, visual impairment.   History reviewed. No pertinent past medical history. History reviewed. No pertinent past surgical history. History reviewed. No pertinent family history. Social History  Substance Use Topics  . Smoking status: Never Smoker   . Smokeless tobacco: None  . Alcohol Use: Yes     Comment: occasionally    Review of Systems  Constitutional: Negative for fever and chills.  HENT: Negative.  Negative for congestion and sore throat.   Eyes: Negative for visual disturbance.  Respiratory: Positive for cough. Negative for shortness of breath.   Cardiovascular: Negative.   Gastrointestinal: Negative.  Negative for nausea and vomiting.  Musculoskeletal: Negative.   Skin: Negative.   Neurological: Positive for dizziness. Negative for syncope and headaches.      Allergies  Review of patient's allergies indicates no known allergies.  Home Medications   Prior to Admission medications   Medication Sig Start Date End Date  Taking? Authorizing Provider  amoxicillin-clavulanate (AUGMENTIN) 875-125 MG per tablet Take 1 tablet by mouth every 12 (twelve) hours. 02/27/14   Kristen N Ward, DO  ibuprofen (ADVIL,MOTRIN) 800 MG tablet Take 1 tablet (800 mg total) by mouth every 8 (eight) hours as needed for mild pain. 02/27/14   Kristen N Ward, DO  methylPREDNIsolone (MEDROL DOSPACK) 4 MG tablet follow package directions 02/27/14   Kristen N Ward, DO   BP 120/62 mmHg  Pulse 70  Temp(Src) 98.2 F (36.8 C) (Oral)  Resp 16  Ht 6' (1.829 m)  Wt 256 lb 6.3 oz (116.3 kg)  BMI 34.77 kg/m2  SpO2 99% Physical Exam  Constitutional: He is oriented to person, place, and time. He appears well-developed and well-nourished.  HENT:  Head: Normocephalic.  Right Ear: External ear normal.  Left Ear: External ear normal.  Nose: Mucosal edema present. Right sinus exhibits frontal sinus tenderness. Left sinus exhibits frontal sinus tenderness.  Mouth/Throat: Oropharynx is clear and moist.  Neck: Normal range of motion. Neck supple.  Cardiovascular: Normal rate and normal heart sounds.   No murmur heard. Pulmonary/Chest: Effort normal and breath sounds normal. He has no wheezes. He has no rales.  Abdominal: Soft. Bowel sounds are normal. He exhibits no distension. There is no tenderness.  Musculoskeletal: Normal range of motion.  Lymphadenopathy:    He has no cervical adenopathy.  Neurological: He is alert and oriented to person, place, and time.  Skin: Skin is warm and dry. No pallor.    ED Course  Procedures (including critical care time) Labs Review Labs Reviewed  CBC - Abnormal; Notable for the following:    Platelets 148 (*)  All other components within normal limits  BASIC METABOLIC PANEL  URINALYSIS, ROUTINE W REFLEX MICROSCOPIC (NOT AT [facility])    Imaging Review No results found. I have personally reviewed and evaluated these images and lab results as part of my medical decision-making.   EKG Interpretation None       MDM   Final diagnoses:  None    1. Dizziness  Patient with a normal neurologic exam, normal EKG, VS who appears well and in NAD. Suspect his dizziness is related to sinus symptoms and recommend antihistamine treatment. Return precautions discussed.     Elpidio Anis, PA-C 11/27/14 1610  Azalia Bilis, MD 11/27/14 [phone]

## 2014-11-27 NOTE — Discharge Instructions (Signed)

## 2014-12-23 ENCOUNTER — Encounter (HOSPITAL_COMMUNITY): Payer: Self-pay | Admitting: Emergency Medicine

## 2016-08-08 ENCOUNTER — Telehealth (INDEPENDENT_AMBULATORY_CARE_PROVIDER_SITE_OTHER): Payer: Self-pay | Admitting: Orthopaedic Surgery

## 2016-08-08 NOTE — Telephone Encounter (Signed)
Copy of [facility] records mailed to patient [id number]

## 2016-08-21 ENCOUNTER — Ambulatory Visit (INDEPENDENT_AMBULATORY_CARE_PROVIDER_SITE_OTHER): Payer: Self-pay | Admitting: Orthopaedic Surgery

## 2017-05-19 ENCOUNTER — Emergency Department (HOSPITAL_COMMUNITY): Payer: Self-pay

## 2017-05-19 ENCOUNTER — Emergency Department (HOSPITAL_COMMUNITY)
Admission: EM | Admit: 2017-05-19 | Discharge: 2017-05-20 | Disposition: A | Discharge status: Home / Self Care | Payer: Self-pay | Attending: Emergency Medicine | Admitting: Emergency Medicine

## 2017-05-19 ENCOUNTER — Encounter (HOSPITAL_COMMUNITY): Payer: Self-pay | Admitting: Emergency Medicine

## 2017-05-19 DIAGNOSIS — Y9389 Activity, other specified: Secondary | ICD-10-CM | POA: Insufficient documentation

## 2017-05-19 DIAGNOSIS — Y929 Unspecified place or not applicable: Secondary | ICD-10-CM | POA: Insufficient documentation

## 2017-05-19 DIAGNOSIS — Y999 Unspecified external cause status: Secondary | ICD-10-CM | POA: Insufficient documentation

## 2017-05-19 DIAGNOSIS — R52 Pain, unspecified: Secondary | ICD-10-CM

## 2017-05-19 DIAGNOSIS — M546 Pain in thoracic spine: Secondary | ICD-10-CM | POA: Insufficient documentation

## 2017-05-19 DIAGNOSIS — T148XXA Other injury of unspecified body region, initial encounter: Secondary | ICD-10-CM | POA: Insufficient documentation

## 2017-05-19 DIAGNOSIS — J45909 Unspecified asthma, uncomplicated: Secondary | ICD-10-CM | POA: Insufficient documentation

## 2017-05-19 DIAGNOSIS — G8929 Other chronic pain: Secondary | ICD-10-CM | POA: Insufficient documentation

## 2017-05-19 DIAGNOSIS — X500XXA Overexertion from strenuous movement or load, initial encounter: Secondary | ICD-10-CM | POA: Insufficient documentation

## 2017-05-19 DIAGNOSIS — Z79899 Other long term (current) drug therapy: Secondary | ICD-10-CM | POA: Insufficient documentation

## 2017-05-19 MED ORDER — IBUPROFEN 800 MG PO TABS
800.0000 mg | ORAL_TABLET | Freq: Once | ORAL | Status: AC
  Administered 2017-05-19: 800 mg via ORAL
  Filled 2017-05-19: qty 1
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
## 2017-05-19 NOTE — ED Notes (Signed)
Acuity 4; see provider note for assessment   
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
## 2017-05-20 MED ORDER — METHOCARBAMOL 500 MG PO TABS: 500.0000 mg | ORAL_TABLET | Freq: Every evening | ORAL | 0 refills | Status: DC | PRN

## 2017-05-20 MED ORDER — MENTHOL (TOPICAL ANALGESIC) 5 % EX PTCH: 1.0000 | MEDICATED_PATCH | Freq: Two times a day (BID) | CUTANEOUS | 0 refills | Status: DC | PRN

## 2017-05-20 MED ORDER — NAPROXEN 500 MG PO TABS: 500.0000 mg | ORAL_TABLET | Freq: Two times a day (BID) | ORAL | 0 refills | Status: DC
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
## 2017-05-27 ENCOUNTER — Emergency Department (HOSPITAL_COMMUNITY)
Admission: EM | Admit: 2017-05-27 | Discharge: 2017-05-27 | Disposition: A | Discharge status: Home / Self Care | Payer: Medicaid Other | Attending: Emergency Medicine | Admitting: Emergency Medicine

## 2017-05-27 ENCOUNTER — Emergency Department (HOSPITAL_COMMUNITY): Payer: Medicaid Other

## 2017-05-27 ENCOUNTER — Other Ambulatory Visit: Payer: Self-pay

## 2017-05-27 ENCOUNTER — Encounter (HOSPITAL_COMMUNITY): Payer: Self-pay

## 2017-05-27 DIAGNOSIS — Y929 Unspecified place or not applicable: Secondary | ICD-10-CM | POA: Insufficient documentation

## 2017-05-27 DIAGNOSIS — Y939 Activity, unspecified: Secondary | ICD-10-CM | POA: Insufficient documentation

## 2017-05-27 DIAGNOSIS — Y999 Unspecified external cause status: Secondary | ICD-10-CM | POA: Insufficient documentation

## 2017-05-27 DIAGNOSIS — W109XXA Fall (on) (from) unspecified stairs and steps, initial encounter: Secondary | ICD-10-CM | POA: Insufficient documentation

## 2017-05-27 DIAGNOSIS — S4992XA Unspecified injury of left shoulder and upper arm, initial encounter: Secondary | ICD-10-CM | POA: Insufficient documentation

## 2017-05-27 MED ORDER — ACETAMINOPHEN 500 MG PO TABS: 1000.0000 mg | ORAL_TABLET | Freq: Three times a day (TID) | ORAL | 0 refills | Status: DC

## 2017-05-27 MED ORDER — ACETAMINOPHEN 500 MG PO TABS: 1000.0000 mg | ORAL_TABLET | Freq: Three times a day (TID) | ORAL | 0 refills | Status: AC

## 2017-05-27 MED ORDER — IBUPROFEN 600 MG PO TABS: 600.0000 mg | ORAL_TABLET | Freq: Four times a day (QID) | ORAL | 0 refills | Status: DC | PRN

## 2017-05-27 MED ORDER — ACETAMINOPHEN 500 MG PO TABS
1000.0000 mg | ORAL_TABLET | Freq: Once | ORAL | Status: AC
  Administered 2017-05-27: 1000 mg via ORAL
  Filled 2017-05-27: qty 2

## 2017-05-27 MED ORDER — CYCLOBENZAPRINE HCL 10 MG PO TABS: 5.0000 mg | ORAL_TABLET | Freq: Once | ORAL | Status: DC

## 2017-05-27 MED ORDER — CYCLOBENZAPRINE HCL 10 MG PO TABS: 10.0000 mg | ORAL_TABLET | Freq: Every day | ORAL | 0 refills | Status: DC

## 2017-05-27 NOTE — ED Triage Notes (Signed)
Patient states he fell last night due missing a step while going down. Patient states he heard a pop in his left shoulder. Patient states he is unable to raise his left arm.

## 2017-05-27 NOTE — ED Provider Notes (Signed)
[facility]-EMERGENCY DEPT Provider Note  CSN: [id number] Arrival date & time: 05/27/17 [id number]  Chief Complaint(s) Shoulder Pain  HPI Micheal Garrison is a 27 y.o. male presents to the emergency department with left shoulder pain following a mechanical fall that occurred last night.  Patient reports missing a step while going down the stairs causing him to fall and land on his left shoulder.  Pain is been there since fall.  Aching and throbbing in nature.  Constant.  Exacerbated with movement and palpation of the left shoulder.  Reports that he heard a pop.  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  Denies any other trauma associated with the fall.  Has tried taking Motrin for pain control with mild relief.  Denies any other physical complaints.  HPI  Past Medical History Past Medical History:  Diagnosis Date  . Asthma    Patient Active Problem List   Diagnosis Date Noted  . Pain from implanted hardware 08/13/2011  . TINEA PEDIS 04/20/2009  . OVERWEIGHT 04/20/2009  . DEPRESSION 04/20/2009  . ATTENTION DEFICIT HYPERACTIVITY DISORDER 04/20/2009  . ASTHMA 04/20/2009  . GERD 04/20/2009  . ACNE VULGARIS 04/20/2009  . ANEMIA, SECONDARY TO ACUTE BLOOD LOSS 03/30/2009  . HYPERGLYCEMIA 03/30/2009  . ELEVATED BLOOD PRESSURE WITHOUT DIAGNOSIS OF HYPERTENSION 03/30/2009  . 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 ONYCHOMYCOSIS, TOENAILS 10/14/2008   Home Medication(s) Prior to Admission medications   Medication Sig Start Date End Date Taking? Authorizing Provider  acetaminophen (TYLENOL) 500 MG tablet Take 2 tablets (1,000 mg total) by mouth every 8 (eight) hours for 5 days. Do not take more than 4000 mg of acetaminophen (Tylenol) in a 24-hour period. Please note that other medicines that you may be prescribed may have Tylenol as well. 05/27/17 06/01/17  Nira Connardama, Vaudie Engebretsen Eduardo, MD  albuterol (PROVENTIL HFA;VENTOLIN HFA) 108 (90 BASE) MCG/ACT inhaler  Inhale 2 puffs into the lungs every 6 (six) hours as needed for wheezing or shortness of breath. 11/01/14   Horton, Mayer Maskerourtney F, MD  amoxicillin-clavulanate (AUGMENTIN) 875-125 MG per tablet Take 1 tablet by mouth every 12 (twelve) hours. 02/27/14   Ward, Layla MawKristen N, DO  Cetirizine HCl (ZYRTEC ALLERGY) 10 MG CAPS Take 1 capsule (10 mg total) by mouth daily. 11/27/14   Elpidio AnisUpstill, Shari, PA-C  cyclobenzaprine (FLEXERIL) 10 MG tablet Take 1 tablet (10 mg total) by mouth at bedtime for 10 days. 05/27/17 06/06/17  Nira Connardama, Lachina Salsberry Eduardo, MD  ibuprofen (ADVIL,MOTRIN) 600 MG tablet Take 1 tablet (600 mg total) by mouth every 6 (six) hours as needed. 05/27/17   Alysse Rathe, Amadeo GarnetPedro Eduardo, MD  Menthol (ICY HOT BACK) 5 % PTCH Apply 1 patch topically 2 (two) times daily as needed. 05/20/17   Georgiana ShoreMitchell, Jessica B, PA-C  methocarbamol (ROBAXIN) 500 MG tablet Take 1 tablet (500 mg total) by mouth at bedtime as needed. 05/20/17   Mathews RobinsonsMitchell, Jessica B, PA-C  methylPREDNIsolone (MEDROL DOSPACK) 4 MG tablet follow package directions 02/27/14   Ward, Layla MawKristen N, DO  naproxen (NAPROSYN) 500 MG tablet Take 1 tablet (500 mg total) by mouth 2 (two) times daily with a meal. 05/20/17   Mathews RobinsonsMitchell, Jessica B, PA-C  triamcinolone (NASACORT AQ) 55 MCG/ACT AERO nasal inhaler Place 2 sprays into the nose daily. 11/27/14   Elpidio AnisUpstill, Shari, PA-C  Past Surgical History Past Surgical History:  Procedure Laterality Date  . HARDWARE REMOVAL  08/13/2011   Procedure: HARDWARE REMOVAL;  Surgeon: Kathryne Hitch, MD;  Location: [facility];  Service: Orthopedics;  Laterality: Right;  Removal of distal interlocking screws right tibial nail  . LEG SURGERY     Family History Family History  Problem Relation Age of Onset  . Diabetes Mother   . Sickle cell trait Mother     Social History Social History   Tobacco Use  . Smoking  status: Never Smoker  . Smokeless tobacco: Never Used  Substance Use Topics  . Alcohol use: Yes    Comment: occasionally  . Drug use: No   Allergies Patient has no known allergies.  Review of Systems Review of Systems All other systems are reviewed and are negative for acute change except as noted in the HPI  Physical Exam Vital Signs  I have reviewed the triage vital signs BP 134/82 (BP Location: Right Arm)   Pulse 73   Temp 98 F (36.7 C) (Oral)   Resp 18   Ht 6' (1.829 m)   Wt 108.9 kg (240 lb)   SpO2 100%   BMI 32.55 kg/m   Physical Exam  Constitutional: He is oriented to person, place, and time. He appears well-developed and well-nourished. No distress.  HENT:  Head: Normocephalic and atraumatic.  Right Ear: External ear normal.  Left Ear: External ear normal.  Nose: Nose normal.  Mouth/Throat: Mucous membranes are normal. No trismus in the jaw.  Eyes: Conjunctivae and EOM are normal. No scleral icterus.  Neck: Normal range of motion and phonation normal.  Cardiovascular: Normal rate and regular rhythm.  Pulmonary/Chest: Effort normal. No stridor. No respiratory distress.  Abdominal: He exhibits no distension.  Musculoskeletal: He exhibits no edema.       Left shoulder: He exhibits decreased range of motion, tenderness and pain. He exhibits no bony tenderness, no swelling, no crepitus, no deformity, normal pulse and normal strength.  Clavicles stable  Neurological: He is alert and oriented to person, place, and time.  Skin: He is not diaphoretic.  Psychiatric: He has a normal mood and affect. His behavior is normal.  Vitals reviewed.   ED Results and Treatments Labs (all labs ordered are listed, but only abnormal results are displayed) Labs Reviewed - No data to display                                                                                                                       EKG  EKG Interpretation  Date/Time:    Ventricular Rate:    PR  Interval:    QRS Duration:   QT Interval:    QTC Calculation:   R Axis:     Text Interpretation:        Radiology Dg Shoulder Left  Result Date: 05/27/2017 CLINICAL DATA:  Left shoulder pain after fall. EXAM: LEFT SHOULDER - 2+ VIEW COMPARISON:  Left shoulder x-rays dated January 26, 2015. FINDINGS: There is no evidence of fracture or dislocation. There is no evidence of arthropathy or other focal bone abnormality. Soft tissues are unremarkable. IMPRESSION: Negative. Electronically Signed   By: Obie Dredge M.D.   On: 05/27/2017 09:41   Pertinent labs & imaging results that were available during my care of the patient were reviewed by me and considered in my medical decision making (see chart for details).  Medications Ordered in ED Medications  acetaminophen (TYLENOL) tablet 1,000 mg (1,000 mg Oral Given 05/27/17 1042)                                                                                                                                    Procedures Procedures  (including critical care time)  Medical Decision Making / ED Course I have reviewed the nursing notes for this encounter and the patient's prior records (if available in EHR or on provided paperwork).    Mechanical fall resulting in left shoulder injury.  Neurovascularly intact distal to the injury.  Plain film without evidence of fracture or dislocation.  Likely soft tissue injury, including possible rotator cuff injury.  Placed in sling and immobilizer.  Provided with additional pain medicine.  Recommended also follow-up if pain persist for more than 2 weeks.  The patient appears reasonably screened and/or stabilized for discharge and I doubt any other medical condition or other [facility] requiring further screening, evaluation, or treatment in the ED at this time prior to discharge.  The patient is safe for discharge with strict return precautions.     Final Clinical Impression(s) / ED Diagnoses Final  diagnoses:  Injury of left shoulder, initial encounter    Disposition: Discharge  Condition: Good  I have discussed the results, Dx and Tx plan with the patient who expressed understanding and agree(s) with the plan. Discharge instructions discussed at great length. The patient was given strict return precautions who verbalized understanding of the instructions. No further questions at time of discharge.    ED Discharge Orders        Ordered    ibuprofen (ADVIL,MOTRIN) 600 MG tablet  Every 6 hours PRN,   Status:  Discontinued     05/27/17 1054    acetaminophen (TYLENOL) 500 MG tablet  Every 8 hours,   Status:  Discontinued     05/27/17 1054    cyclobenzaprine (FLEXERIL) 10 MG tablet  Daily at bedtime,   Status:  Discontinued     05/27/17 1054       Follow Up: Tarry Kos, MD [address] [place] [address] [phone]  Schedule an appointment as soon as possible for a visit  For close follow up to assess for left shoulder injury     This chart was dictated using voice recognition software.  Despite best efforts to proofread,  errors can occur which can change the documentation meaning.   Nira Conn, MD 05/27/17  1114  

## 2017-05-30 ENCOUNTER — Emergency Department (HOSPITAL_COMMUNITY)
Admission: EM | Admit: 2017-05-30 | Discharge: 2017-05-30 | Disposition: A | Discharge status: Home / Self Care | Payer: Medicaid Other | Attending: Emergency Medicine | Admitting: Emergency Medicine

## 2017-05-30 DIAGNOSIS — Y929 Unspecified place or not applicable: Secondary | ICD-10-CM | POA: Insufficient documentation

## 2017-05-30 DIAGNOSIS — S46812A Strain of other muscles, fascia and tendons at shoulder and upper arm level, left arm, initial encounter: Secondary | ICD-10-CM | POA: Insufficient documentation

## 2017-05-30 DIAGNOSIS — Z79899 Other long term (current) drug therapy: Secondary | ICD-10-CM | POA: Insufficient documentation

## 2017-05-30 DIAGNOSIS — J45909 Unspecified asthma, uncomplicated: Secondary | ICD-10-CM | POA: Insufficient documentation

## 2017-05-30 DIAGNOSIS — F909 Attention-deficit hyperactivity disorder, unspecified type: Secondary | ICD-10-CM | POA: Insufficient documentation

## 2017-05-30 DIAGNOSIS — Y999 Unspecified external cause status: Secondary | ICD-10-CM | POA: Insufficient documentation

## 2017-05-30 DIAGNOSIS — Y939 Activity, unspecified: Secondary | ICD-10-CM | POA: Insufficient documentation

## 2017-05-30 DIAGNOSIS — W19XXXA Unspecified fall, initial encounter: Secondary | ICD-10-CM | POA: Insufficient documentation

## 2017-05-30 MED ORDER — MELOXICAM 15 MG PO TABS: 15.0000 mg | ORAL_TABLET | Freq: Every day | ORAL | 0 refills | Status: DC

## 2017-05-30 MED ORDER — CYCLOBENZAPRINE HCL 10 MG PO TABS: 10.0000 mg | ORAL_TABLET | Freq: Two times a day (BID) | ORAL | 0 refills | Status: DC | PRN

## 2017-05-30 NOTE — ED Provider Notes (Signed)
[facility]-EMERGENCY DEPT Provider Note   CSN: [id number] Arrival date & time: 05/30/17  1018     History   Chief Complaint Chief Complaint  Patient presents with  . Shoulder Pain  . Medication Refill    HPI Micheal Garrison is a 27 y.o. male.  HPI Micheal Garrison is a 27 y.o. male presents to emergency department complaining of left shoulder pain.  Patient states he fell a couple days ago, injuring his left shoulder.  Pain worsened with any shoulder movement. Pain is sharp. He states he was seen in emergency department, had negative x-ray, and was told this could be his rotator cuff.  He made an appointment with orthopedic specialist but states his appointment is not until 4 weeks from today because of insurance problems.  He states he has been keeping his arm in a sling most of the time and he does take it out and stretches at home.  He denies any strenuous activity.  He denies any numbness or weakness to his hand.  The pain is mainly in the upper back, and not in the shoulder at this time.  He was prescribed ibuprofen, Tylenol, Flexeril, states he ran out, and states over-the-counter ibuprofen Tylenol no longer helping.  No new injuries.  Past Medical History:  Diagnosis Date  . Asthma     Patient Active Problem List   Diagnosis Date Noted  . Pain from implanted hardware 08/13/2011  . TINEA PEDIS 04/20/2009  . OVERWEIGHT 04/20/2009  . DEPRESSION 04/20/2009  . ATTENTION DEFICIT HYPERACTIVITY DISORDER 04/20/2009  . ASTHMA 04/20/2009  . GERD 04/20/2009  . ACNE VULGARIS 04/20/2009  . ANEMIA, SECONDARY TO ACUTE BLOOD LOSS 03/30/2009  . HYPERGLYCEMIA 03/30/2009  . ELEVATED BLOOD PRESSURE WITHOUT DIAGNOSIS OF HYPERTENSION 03/30/2009  . 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 ONYCHOMYCOSIS, TOENAILS 10/14/2008    Past Surgical History:  Procedure Laterality Date  . HARDWARE REMOVAL  08/13/2011   Procedure: HARDWARE REMOVAL;  Surgeon: Kathryne Hitchhristopher  Y Blackman, MD;  [facility];  Service: Orthopedics;  Laterality: Right;  Removal of distal interlocking screws right tibial nail  . LEG SURGERY          Home Medications    Prior to Admission medications   Medication Sig Start Date End Date Taking? Authorizing Provider  acetaminophen (TYLENOL) 500 MG tablet Take 2 tablets (1,000 mg total) by mouth every 8 (eight) hours for 5 days. Do not take more than 4000 mg of acetaminophen (Tylenol) in a 24-hour period. Please note that other medicines that you may be prescribed may have Tylenol as well. 05/27/17 06/01/17 Yes Cardama, Amadeo GarnetPedro Eduardo, MD  albuterol (PROVENTIL HFA;VENTOLIN HFA) 108 (90 BASE) MCG/ACT inhaler Inhale 2 puffs into the lungs every 6 (six) hours as needed for wheezing or shortness of breath. 11/01/14  Yes Horton, Mayer Maskerourtney F, MD  cyclobenzaprine (FLEXERIL) 10 MG tablet Take 1 tablet (10 mg total) by mouth at bedtime for 10 days. 05/27/17 06/06/17 Yes Cardama, Amadeo GarnetPedro Eduardo, MD  ibuprofen (ADVIL,MOTRIN) 600 MG tablet Take 1 tablet (600 mg total) by mouth every 6 (six) hours as needed. 05/27/17  Yes Cardama, Amadeo GarnetPedro Eduardo, MD  Menthol (ICY HOT BACK) 5 % PTCH Apply 1 patch topically 2 (two) times daily as needed. 05/20/17  Yes Mathews RobinsonsMitchell, Jessica B, PA-C  methocarbamol (ROBAXIN) 500 MG tablet Take 1 tablet (500 mg total) by mouth at bedtime as needed. 05/20/17  Yes Mathews RobinsonsMitchell, Jessica B, PA-C  naproxen (NAPROSYN) 500 MG tablet  Take 1 tablet (500 mg total) by mouth 2 (two) times daily with a meal. 05/20/17  Yes Mathews Robinsons B, PA-C  amoxicillin-clavulanate (AUGMENTIN) 875-125 MG per tablet Take 1 tablet by mouth every 12 (twelve) hours. Patient not taking: Reported on 05/30/2017 02/27/14   Ward, Layla Maw, DO  Cetirizine HCl (ZYRTEC ALLERGY) 10 MG CAPS Take 1 capsule (10 mg total) by mouth daily. Patient not taking: Reported on 05/30/2017 11/27/14   Elpidio Anis, PA-C  methylPREDNIsolone (MEDROL DOSPACK) 4 MG tablet follow package  directions Patient not taking: Reported on 05/30/2017 02/27/14   Ward, Layla Maw, DO  triamcinolone (NASACORT AQ) 55 MCG/ACT AERO nasal inhaler Place 2 sprays into the nose daily. Patient not taking: Reported on 05/30/2017 11/27/14   Elpidio Anis, PA-C    Family History Family History  Problem Relation Age of Onset  . Diabetes Mother   . Sickle cell trait Mother     Social History Social History   Tobacco Use  . Smoking status: Never Smoker  . Smokeless tobacco: Never Used  Substance Use Topics  . Alcohol use: Yes    Comment: occasionally  . Drug use: No     Allergies   Patient has no known allergies.   Review of Systems Review of Systems  Constitutional: Negative for chills and fever.  Respiratory: Negative for cough, chest tightness and shortness of breath.   Cardiovascular: Negative for chest pain, palpitations and leg swelling.  Musculoskeletal: Positive for arthralgias and myalgias. Negative for neck pain and neck stiffness.  Skin: Negative for rash.  Allergic/Immunologic: Negative for immunocompromised state.  Neurological: Negative for weakness and numbness.  All other systems reviewed and are negative.    Physical Exam Updated Vital Signs BP 117/82 (BP Location: Right Arm)   Pulse 86   Temp 98.4 F (36.9 C) (Oral)   Resp 14   SpO2 99%   Physical Exam  Constitutional: He appears well-developed and well-nourished. No distress.  HENT:  Head: Normocephalic and atraumatic.  Eyes: Conjunctivae are normal.  Neck: Neck supple.  Cardiovascular: Normal rate, regular rhythm and normal heart sounds.  Pulmonary/Chest: Effort normal. No respiratory distress. He has no wheezes. He has no rales.  Musculoskeletal: He exhibits no edema.       Back:  Tenderness to palpation over periscapular muscles, along the medial border of the scapular.  Pain with any range of motion of the left shoulder, full range of motion of the shoulder joint.  No tenderness over the joint.   Distal radial pulses intact.  No swelling or bruising over the muscle.  Neurological: He is alert.  Skin: Skin is warm and dry.  Nursing note and vitals reviewed.    ED Treatments / Results  Labs (all labs ordered are listed, but only abnormal results are displayed) Labs Reviewed - No data to display  EKG None  Radiology No results found.  Procedures Procedures (including critical care time)  Medications Ordered in ED Medications - No data to display   Initial Impression / Assessment and Plan / ED Course  I have reviewed the triage vital signs and the nursing notes.  Pertinent labs & imaging results that were available during my care of the patient were reviewed by me and considered in my medical decision making (see chart for details).     Patient in emergency department with persistent pain which he states is in the shoulder, however based on exam, pain is mainly over the periscapular muscles of the upper back.  Most likely muscular strain based on exam.  Will refill muscle relaxant and NSAIDs.  Discussed stretches, starting rehab to the shoulder, and avoiding sling as much.  Patient agreed. Follow up with PCP  Vitals:   05/30/17 1037  BP: 117/82  Pulse: 86  Resp: 14  Temp: 98.4 F (36.9 C)  TempSrc: Oral  SpO2: 99%     Final Clinical Impressions(s) / ED Diagnoses   Final diagnoses:  Strain of left trapezius muscle, initial encounter    ED Discharge Orders        Ordered    cyclobenzaprine (FLEXERIL) 10 MG tablet  2 times daily PRN     05/30/17 1410    meloxicam (MOBIC) 15 MG tablet  Daily     05/30/17 1410       Jaynie Crumble, PA-C 05/30/17 1411    Shaune Pollack, MD 05/31/17 [phone]

## 2017-05-30 NOTE — Discharge Instructions (Addendum)
Take Mobic as prescribed daily for pain and inflammation.  Flexeril for muscle spasms.  Please follow-up with family doctor for further evaluation treatment as needed. Start shoulder exercises and stretches at home.

## 2017-05-30 NOTE — ED Triage Notes (Signed)
Reports that he was here couple days ago and told to follow up with ortho and wear sling on left arm. Pt reports he needs more pain medications and muscle relaxer's until he can get in to see ortho next month when insurance kicks in.

## 2017-05-30 NOTE — ED Notes (Signed)
Bed: WTR5 Expected date:  Expected time:  Means of arrival:  Comments: 

## 2017-06-21 ENCOUNTER — Other Ambulatory Visit: Payer: Self-pay

## 2017-06-21 ENCOUNTER — Emergency Department (HOSPITAL_COMMUNITY): Payer: Medicaid Other

## 2017-06-21 ENCOUNTER — Emergency Department (HOSPITAL_COMMUNITY)
Admission: EM | Admit: 2017-06-21 | Discharge: 2017-06-21 | Disposition: A | Discharge status: Home / Self Care | Payer: Medicaid Other | Attending: Emergency Medicine | Admitting: Emergency Medicine

## 2017-06-21 ENCOUNTER — Encounter (HOSPITAL_COMMUNITY): Payer: Self-pay

## 2017-06-21 DIAGNOSIS — Y998 Other external cause status: Secondary | ICD-10-CM | POA: Insufficient documentation

## 2017-06-21 DIAGNOSIS — Y9361 Activity, american tackle football: Secondary | ICD-10-CM | POA: Insufficient documentation

## 2017-06-21 DIAGNOSIS — Y929 Unspecified place or not applicable: Secondary | ICD-10-CM | POA: Insufficient documentation

## 2017-06-21 DIAGNOSIS — Z79899 Other long term (current) drug therapy: Secondary | ICD-10-CM | POA: Insufficient documentation

## 2017-06-21 DIAGNOSIS — J45909 Unspecified asthma, uncomplicated: Secondary | ICD-10-CM | POA: Insufficient documentation

## 2017-06-21 DIAGNOSIS — S60222A Contusion of left hand, initial encounter: Secondary | ICD-10-CM

## 2017-06-21 DIAGNOSIS — Y33XXXA Other specified events, undetermined intent, initial encounter: Secondary | ICD-10-CM | POA: Insufficient documentation

## 2017-06-21 MED ORDER — NAPROXEN 500 MG PO TABS: 500.0000 mg | ORAL_TABLET | Freq: Two times a day (BID) | ORAL | 0 refills | Status: DC

## 2017-06-21 MED ORDER — KETOROLAC TROMETHAMINE 30 MG/ML IJ SOLN
30.0000 mg | Freq: Once | INTRAMUSCULAR | Status: DC
Start: 2017-06-21 — End: 2017-06-21

## 2017-06-21 NOTE — ED Provider Notes (Signed)
[facility]-EMERGENCY DEPT Provider Note   CSN: [id number] Arrival date & time: 06/21/17  0753     History   Chief Complaint Chief Complaint  Patient presents with  . Hand Pain    HPI Micheal Garrison is a 27 y.o. male who presents to ED for evaluation of nondominant left second digit pain at the MCP joint since yesterday.  He states that he was playing football with his younger cousins when he heard a pop.  He is unsure of the exact mechanism of injury.  Denies any prior fracture, dislocations or procedures in the area.  He has not taken any medications prior to arrival to help with his symptoms.  He denies any other injuries.  HPI  Past Medical History:  Diagnosis Date  . Asthma     Patient Active Problem List   Diagnosis Date Noted  . Pain from implanted hardware 08/13/2011  . TINEA PEDIS 04/20/2009  . OVERWEIGHT 04/20/2009  . DEPRESSION 04/20/2009  . ATTENTION DEFICIT HYPERACTIVITY DISORDER 04/20/2009  . ASTHMA 04/20/2009  . GERD 04/20/2009  . ACNE VULGARIS 04/20/2009  . ANEMIA, SECONDARY TO ACUTE BLOOD LOSS 03/30/2009  . HYPERGLYCEMIA 03/30/2009  . ELEVATED BLOOD PRESSURE WITHOUT DIAGNOSIS OF HYPERTENSION 03/30/2009  . 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 ONYCHOMYCOSIS, TOENAILS 10/14/2008    Past Surgical History:  Procedure Laterality Date  . HARDWARE REMOVAL  08/13/2011   Procedure: HARDWARE REMOVAL;  Surgeon: Kathryne Hitch, MD;  Location: [facility] OR;  Service: Orthopedics;  Laterality: Right;  Removal of distal interlocking screws right tibial nail  . LEG SURGERY          Home Medications    Prior to Admission medications   Medication Sig Start Date End Date Taking? Authorizing Provider  albuterol (PROVENTIL HFA;VENTOLIN HFA) 108 (90 BASE) MCG/ACT inhaler Inhale 2 puffs into the lungs every 6 (six) hours as needed for wheezing or shortness of breath. 11/01/14   Horton, Mayer Masker, MD  amoxicillin-clavulanate  (AUGMENTIN) 875-125 MG per tablet Take 1 tablet by mouth every 12 (twelve) hours. Patient not taking: Reported on 05/30/2017 02/27/14   Ward, Layla Maw, DO  Cetirizine HCl (ZYRTEC ALLERGY) 10 MG CAPS Take 1 capsule (10 mg total) by mouth daily. Patient not taking: Reported on 05/30/2017 11/27/14   Elpidio Anis, PA-C  cyclobenzaprine (FLEXERIL) 10 MG tablet Take 1 tablet (10 mg total) by mouth 2 (two) times daily as needed for muscle spasms. 05/30/17   Kirichenko, Tatyana, PA-C  ibuprofen (ADVIL,MOTRIN) 600 MG tablet Take 1 tablet (600 mg total) by mouth every 6 (six) hours as needed. 05/27/17   Nira Conn, MD  meloxicam (MOBIC) 15 MG tablet Take 1 tablet (15 mg total) by mouth daily. 05/30/17   Kirichenko, Tatyana, PA-C  Menthol (ICY HOT BACK) 5 % PTCH Apply 1 patch topically 2 (two) times daily as needed. 05/20/17   Georgiana Shore, PA-C  methocarbamol (ROBAXIN) 500 MG tablet Take 1 tablet (500 mg total) by mouth at bedtime as needed. 05/20/17   Georgiana Shore, PA-C  methylPREDNIsolone (MEDROL DOSPACK) 4 MG tablet follow package directions Patient not taking: Reported on 05/30/2017 02/27/14   Ward, Layla Maw, DO  naproxen (NAPROSYN) 500 MG tablet Take 1 tablet (500 mg total) by mouth 2 (two) times daily. 06/21/17   Corneilus Heggie, PA-C  triamcinolone (NASACORT AQ) 55 MCG/ACT AERO nasal inhaler Place 2 sprays into the nose daily. Patient not taking: Reported on 05/30/2017 11/27/14   Upstill,  Melvenia Beam, PA-C    Family History Family History  Problem Relation Age of Onset  . Diabetes Mother   . Sickle cell trait Mother     Social History Social History   Tobacco Use  . Smoking status: Never Smoker  . Smokeless tobacco: Never Used  Substance Use Topics  . Alcohol use: Yes    Comment: occasionally  . Drug use: No     Allergies   Patient has no known allergies.   Review of Systems Review of Systems  Musculoskeletal: Positive for arthralgias. Negative for back pain, joint  swelling and myalgias.  Skin: Negative for color change and wound.  Neurological: Negative for seizures and weakness.     Physical Exam Updated Vital Signs BP 131/82 (BP Location: Right Arm)   Pulse 68   Temp 98.1 F (36.7 C) (Oral)   Resp 17   Ht 6' (1.829 m)   Wt 108.9 kg (240 lb)   SpO2 99%   BMI 32.55 kg/m   Physical Exam  Constitutional: He appears well-developed and well-nourished. No distress.  HENT:  Head: Normocephalic and atraumatic.  Eyes: Conjunctivae and EOM are normal. No scleral icterus.  Neck: Normal range of motion.  Pulmonary/Chest: Effort normal. No respiratory distress.  Musculoskeletal: Normal range of motion. He exhibits tenderness. He exhibits no edema or deformity.  Tenderness to palpation of the left second digit MCP joint.  No visible deformity, erythema, warmth of joint noted.  Normal strength noted.  2+ radial pulse noted.  Normal sensation noted.  Neurological: He is alert.  Skin: No rash noted. He is not diaphoretic.  Psychiatric: He has a normal mood and affect.  Nursing note and vitals reviewed.    ED Treatments / Results  Labs (all labs ordered are listed, but only abnormal results are displayed) Labs Reviewed - No data to display  EKG None  Radiology Dg Hand Complete Left  Result Date: 06/21/2017 CLINICAL DATA:  Status post injury with pain in the left second carpal metacarpal joint. EXAM: LEFT HAND - COMPLETE 3+ VIEW COMPARISON:  None. FINDINGS: There is no evidence of fracture or dislocation. There is no evidence of arthropathy or other focal bone abnormality. Soft tissues are unremarkable. IMPRESSION: Negative. Electronically Signed   By: Sherian Rein M.D.   On: 06/21/2017 09:28    Procedures Procedures (including critical care time)  Medications Ordered in ED Medications  ketorolac (TORADOL) 30 MG/ML injection 30 mg (has no administration in time range)     Initial Impression / Assessment and Plan / ED Course  I have  reviewed the triage vital signs and the nursing notes.  Pertinent labs & imaging results that were available during my care of the patient were reviewed by me and considered in my medical decision making (see chart for details).     Patient presents to ED for evaluation of nondominant left index finger MCP joint pain after unknown injury yesterday when playing football with younger cousins.  Area is neurovascularly intact.  No changes to range of motion.  No changes to joint noted.  X-ray returned as negative for acute abnormality.  Doubt infectious or vascular cause of symptoms.  Most likely due to contusion.  Will give finger splint, anti-inflammatories and follow-up with hand specialist for further evaluation.  Advised to return to ED for any severe worsening symptoms.  Portions of this note were generated with Scientist, clinical (histocompatibility and immunogenetics). Dictation errors may occur despite best attempts at proofreading.   Final Clinical Impressions(s) /  ED Diagnoses   Final diagnoses:  Contusion of left hand, initial encounter    ED Discharge Orders        Ordered    naproxen (NAPROSYN) 500 MG tablet  2 times daily     06/21/17 1143       Dietrich Pates, PA-C 06/21/17 1150    Benjiman Core, MD 06/21/17 1545

## 2017-06-21 NOTE — ED Notes (Signed)
Bed: WA02 Expected date:  Expected time:  Means of arrival:  Comments: 

## 2017-06-21 NOTE — ED Notes (Signed)
Ortho tec at bedside 

## 2017-06-21 NOTE — ED Triage Notes (Signed)
Pt states he was wrestling and playing football with cousins yesterday when he heard a pop. Left index finger and palm pain

## 2017-11-28 ENCOUNTER — Emergency Department (HOSPITAL_COMMUNITY)
Admission: EM | Admit: 2017-11-28 | Discharge: 2017-11-28 | Disposition: A | Discharge status: Home / Self Care | Payer: Medicaid Other | Attending: Emergency Medicine | Admitting: Emergency Medicine

## 2017-11-28 ENCOUNTER — Other Ambulatory Visit: Payer: Self-pay

## 2017-11-28 DIAGNOSIS — R221 Localized swelling, mass and lump, neck: Secondary | ICD-10-CM

## 2017-11-28 DIAGNOSIS — J029 Acute pharyngitis, unspecified: Secondary | ICD-10-CM | POA: Insufficient documentation

## 2017-11-28 DIAGNOSIS — Z79899 Other long term (current) drug therapy: Secondary | ICD-10-CM | POA: Insufficient documentation

## 2017-11-28 DIAGNOSIS — R600 Localized edema: Secondary | ICD-10-CM | POA: Insufficient documentation

## 2017-11-28 DIAGNOSIS — J45909 Unspecified asthma, uncomplicated: Secondary | ICD-10-CM | POA: Insufficient documentation

## 2017-11-28 MED ORDER — PENICILLIN G BENZATHINE 1200000 UNIT/2ML IM SUSP
1.2000 10*6.[IU] | Freq: Once | INTRAMUSCULAR | Status: AC
  Administered 2017-11-28: 1.2 10*6.[IU] via INTRAMUSCULAR
  Filled 2017-11-28: qty 2

## 2017-11-28 MED ORDER — HYDROCODONE-ACETAMINOPHEN 7.5-325 MG/15ML PO SOLN
15.0000 mL | Freq: Four times a day (QID) | ORAL | 0 refills | Status: AC | PRN
Start: 2017-11-28 — End: 2018-11-28

## 2017-11-28 MED ORDER — DEXAMETHASONE 1 MG/ML PO CONC
10.0000 mg | Freq: Once | ORAL | Status: AC
  Administered 2017-11-28: 10 mg via ORAL
  Filled 2017-11-28 (×2): qty 10

## 2017-11-28 NOTE — ED Notes (Signed)
ED Provider at bedside. 

## 2017-11-28 NOTE — ED Provider Notes (Signed)
[facility]-EMERGENCY DEPT Provider Note   CSN: [id number] Arrival date & time: 11/28/17  1156     History   Chief Complaint Chief Complaint  Patient presents with  . Sore Throat  . Oral Swelling    HPI Micheal Garrison is a 27 y.o. male who presents with sore throat and difficulty swallowing. No significant PMH. He states that he woke up this morning with a sore throat. It is severe in nature. He denies any known sick contacts. He states his uvula is swollen and it hurts to swallow but is able to swallow secretions. Nothing makes it better or worse. No meds tried prior to arrival.   HPI  Past Medical History:  Diagnosis Date  . Asthma     Patient Active Problem List   Diagnosis Date Noted  . Pain from implanted hardware 08/13/2011  . TINEA PEDIS 04/20/2009  . OVERWEIGHT 04/20/2009  . DEPRESSION 04/20/2009  . ATTENTION DEFICIT HYPERACTIVITY DISORDER 04/20/2009  . ASTHMA 04/20/2009  . GERD 04/20/2009  . ACNE VULGARIS 04/20/2009  . ANEMIA, SECONDARY TO ACUTE BLOOD LOSS 03/30/2009  . HYPERGLYCEMIA 03/30/2009  . ELEVATED BLOOD PRESSURE WITHOUT DIAGNOSIS OF HYPERTENSION 03/30/2009  . 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 ONYCHOMYCOSIS, TOENAILS 10/14/2008    Past Surgical History:  Procedure Laterality Date  . HARDWARE REMOVAL  08/13/2011   Procedure: HARDWARE REMOVAL;  Surgeon: Kathryne Hitch, MD;  Location: [facility] OR;  Service: Orthopedics;  Laterality: Right;  Removal of distal interlocking screws right tibial nail  . LEG SURGERY          Home Medications    Prior to Admission medications   Medication Sig Start Date End Date Taking? Authorizing Provider  albuterol (PROVENTIL HFA;VENTOLIN HFA) 108 (90 BASE) MCG/ACT inhaler Inhale 2 puffs into the lungs every 6 (six) hours as needed for wheezing or shortness of breath. Patient not taking: Reported on 11/28/2017 11/01/14   Horton, Mayer Masker, MD  amoxicillin-clavulanate  (AUGMENTIN) 875-125 MG per tablet Take 1 tablet by mouth every 12 (twelve) hours. Patient not taking: Reported on 05/30/2017 02/27/14   Ward, Layla Maw, DO  Cetirizine HCl (ZYRTEC ALLERGY) 10 MG CAPS Take 1 capsule (10 mg total) by mouth daily. Patient not taking: Reported on 05/30/2017 11/27/14   Elpidio Anis, PA-C  cyclobenzaprine (FLEXERIL) 10 MG tablet Take 1 tablet (10 mg total) by mouth 2 (two) times daily as needed for muscle spasms. Patient not taking: Reported on 11/28/2017 05/30/17   Jaynie Crumble, PA-C  ibuprofen (ADVIL,MOTRIN) 600 MG tablet Take 1 tablet (600 mg total) by mouth every 6 (six) hours as needed. Patient not taking: Reported on 11/28/2017 05/27/17   Nira Conn, MD  meloxicam (MOBIC) 15 MG tablet Take 1 tablet (15 mg total) by mouth daily. Patient not taking: Reported on 11/28/2017 05/30/17   Jaynie Crumble, PA-C  Menthol (ICY HOT BACK) 5 % PTCH Apply 1 patch topically 2 (two) times daily as needed. Patient not taking: Reported on 11/28/2017 05/20/17   Georgiana Shore, PA-C  methocarbamol (ROBAXIN) 500 MG tablet Take 1 tablet (500 mg total) by mouth at bedtime as needed. Patient not taking: Reported on 11/28/2017 05/20/17   Mathews Robinsons B, PA-C  methylPREDNIsolone (MEDROL DOSPACK) 4 MG tablet follow package directions Patient not taking: Reported on 05/30/2017 02/27/14   Ward, Layla Maw, DO  naproxen (NAPROSYN) 500 MG tablet Take 1 tablet (500 mg total) by mouth 2 (two) times daily. Patient not taking: Reported on  11/28/2017 06/21/17   Khatri, Hina, PA-C  triamcinolone (NASACORT AQ) 55 MCG/ACT AERO nasal inhaler Place 2 sprays into the nose daily. Patient not taking: Reported on 05/30/2017 11/27/14   Elpidio Anis, PA-C    Family History Family History  Problem Relation Age of Onset  . Diabetes Mother   . Sickle cell trait Mother     Social History Social History   Tobacco Use  . Smoking status: Never Smoker  . Smokeless tobacco: Never  Used  Substance Use Topics  . Alcohol use: Yes    Comment: occasionally  . Drug use: No     Allergies   Patient has no known allergies.   Review of Systems Review of Systems  Constitutional: Negative for fever.  HENT: Positive for sore throat, trouble swallowing and voice change.   Respiratory: Negative for shortness of breath.   All other systems reviewed and are negative.    Physical Exam Updated Vital Signs BP 138/83 (BP Location: Right Arm)   Pulse 76   Temp 98.2 F (36.8 C) (Oral)   Resp 16   Ht 6' (1.829 m)   Wt 108.9 kg   SpO2 100%   BMI 32.56 kg/m   Physical Exam  Constitutional: He is oriented to person, place, and time. He appears well-developed and well-nourished. No distress.  Calm, cooperative. Slightly muffled voice  HENT:  Head: Normocephalic and atraumatic.  Mouth/Throat: Uvula is midline and mucous membranes are normal. Oropharyngeal exudate, posterior oropharyngeal edema and posterior oropharyngeal erythema present. No tonsillar abscesses. Tonsils are 1+ on the right. Tonsils are 1+ on the left.  Slightly swollen uvula  Eyes: Pupils are equal, round, and reactive to light. Conjunctivae are normal. Right eye exhibits no discharge. Left eye exhibits no discharge. No scleral icterus.  Neck: Normal range of motion.  Cardiovascular: Normal rate.  Pulmonary/Chest: Effort normal. No respiratory distress.  Abdominal: He exhibits no distension.  Neurological: He is alert and oriented to person, place, and time.  Skin: Skin is warm and dry.  Psychiatric: He has a normal mood and affect. His behavior is normal.  Nursing note and vitals reviewed.    ED Treatments / Results  Labs (all labs ordered are listed, but only abnormal results are displayed) Labs Reviewed  GROUP A STREP BY PCR    EKG None  Radiology No results found.  Procedures Procedures (including critical care time)  Medications Ordered in ED Medications  dexamethasone (DECADRON)  1 MG/ML solution 10 mg (10 mg Oral Given 11/28/17 1446)  penicillin g benzathine (BICILLIN LA) 1200000 UNIT/2ML injection 1.2 Million Units (1.2 Million Units Intramuscular Given 11/28/17 1446)     Initial Impression / Assessment and Plan / ED Course  I have reviewed the triage vital signs and the nursing notes.  Pertinent labs & imaging results that were available during my care of the patient were reviewed by me and considered in my medical decision making (see chart for details).  27 year old male presents with a sore throat and swollen uvula. His vitals are normal. He has tolerated PO here in the ED. He refuses strep test. Will treat clinically for strep. He was given IM PCN and oral decadron. He was given rx for pain medicine and strict return precautions  Final Clinical Impressions(s) / ED Diagnoses   Final diagnoses:  Pharyngitis, unspecified etiology  Sore throat  Swollen uvula    ED Discharge Orders    None       Jefm Bryant Jory Sims, PA-C  11/28/17 1501    Arby Barrette, MD 12/05/17 1544

## 2017-11-28 NOTE — ED Triage Notes (Signed)
Patient arrives with c/o throat swelling that started this morning. Patient reports difficulty talking and pain when swallowing. Patient able to maintain secretions, oxygen 100% on room air. Throat appears red. Right tonsil appears swollen.

## 2017-11-28 NOTE — Discharge Instructions (Signed)
You have been been given a steroid and and antibiotic for infection today. This should help with the swelling and pain over the next couple of days You can also drink liquids and soft foods until you are able to tolerate a normal diet Try ice chips for your sore throat Take pain medicine for severe pain Return if you are worsening or not improving in the next 3-4 days

## 2017-11-28 NOTE — ED Notes (Signed)
Pt refused E-swab for strep screen. PA made aware

## 2017-11-28 NOTE — ED Notes (Signed)
Bed: WTR5 Expected date:  Expected time:  Means of arrival:  Comments: 

## 2017-11-28 NOTE — ED Notes (Signed)
Called pharmacy to check on status of medication. Per pharmacy: they are working on sending up medication

## 2018-02-07 ENCOUNTER — Encounter (HOSPITAL_COMMUNITY): Payer: Self-pay | Admitting: Emergency Medicine

## 2018-02-07 ENCOUNTER — Emergency Department (HOSPITAL_COMMUNITY)
Admission: EM | Admit: 2018-02-07 | Discharge: 2018-02-07 | Disposition: A | Discharge status: Home / Self Care | Payer: Medicaid Other | Attending: Emergency Medicine | Admitting: Emergency Medicine

## 2018-02-07 ENCOUNTER — Other Ambulatory Visit: Payer: Self-pay

## 2018-02-07 DIAGNOSIS — B9789 Other viral agents as the cause of diseases classified elsewhere: Secondary | ICD-10-CM | POA: Insufficient documentation

## 2018-02-07 DIAGNOSIS — J069 Acute upper respiratory infection, unspecified: Secondary | ICD-10-CM | POA: Insufficient documentation

## 2018-02-07 LAB — INFLUENZA PANEL BY PCR (TYPE A & B)
INFLBPCR: NEGATIVE
Influenza A By PCR: NEGATIVE

## 2018-02-07 MED ORDER — BENZONATATE 100 MG PO CAPS: 100.0000 mg | ORAL_CAPSULE | Freq: Three times a day (TID) | ORAL | 0 refills | Status: DC

## 2018-02-07 MED ORDER — NAPROXEN 500 MG PO TABS: 500.0000 mg | ORAL_TABLET | Freq: Two times a day (BID) | ORAL | 0 refills | Status: DC

## 2018-02-07 MED ORDER — ONDANSETRON 4 MG PO TBDP
4.0000 mg | ORAL_TABLET | Freq: Once | ORAL | Status: AC
  Administered 2018-02-07: 4 mg via ORAL
  Filled 2018-02-07: qty 1

## 2018-02-07 MED ORDER — IBUPROFEN 200 MG PO TABS
600.0000 mg | ORAL_TABLET | Freq: Once | ORAL | Status: AC
  Administered 2018-02-07: 600 mg via ORAL
  Filled 2018-02-07: qty 3

## 2018-02-07 NOTE — Discharge Instructions (Addendum)
You have been seen today for cough and generalized body aches. Please read and follow all provided instructions.   1. Medications: tessalon for cough, naproxen for pain, usual home medications 2. Treatment: rest, drink plenty of fluids 3. Follow Up: Please follow up with your primary doctor in 2 days for discussion of your diagnoses and further evaluation after today's visit; if you do not have a primary care doctor use the resource guide provided to find one; Please return to the ER for any new or worsening symptoms. Please obtain all of your results from medical records or have your doctors office obtain the results - share them with your doctor - you should be seen at your doctors office. Call today to arrange your follow up.   Take medications as prescribed. Please review all of the medicines and only take them if you do not have an allergy to them. Return to the emergency room for worsening condition or new concerning symptoms. Follow up with your regular doctor. If you don't have a regular doctor use one of the numbers below to establish a primary care doctor.  Please be aware that if you are taking birth control pills, taking other prescriptions, ESPECIALLY ANTIBIOTICS may make the birth control ineffective - if this is the case, either do not engage in sexual activity or use alternative methods of birth control such as condoms until you have finished the medicine and your family doctor says it is OK to restart them. If you are on a blood thinner such as COUMADIN, be aware that any other medicine that you take may cause the coumadin to either work too much, or not enough - you should have your coumadin level rechecked in next 7 days if this is the case.  ?  It is also a possibility that you have an allergic reaction to any of the medicines that you have been prescribed - Everybody reacts differently to medications and while MOST people have no trouble with most medicines, you may have a reaction  such as nausea, vomiting, rash, swelling, shortness of breath. If this is the case, please stop taking the medicine immediately and contact your physician.  ?  You should return to the ER if you develop severe or worsening symptoms.   Emergency Department Resource Guide 1) Find a Doctor and Pay Out of Pocket Although you won't have to find out who is covered by your insurance plan, it is a good idea to ask around and get recommendations. You will then need to call the office and see if the doctor you have chosen will accept you as a new patient and what types of options they offer for patients who are self-pay. Some doctors offer discounts or will set up payment plans for their patients who do not have insurance, but you will need to ask so you aren't surprised when you get to your appointment.  2) Contact Your Local Health Department Not all health departments have doctors that can see patients for sick visits, but many do, so it is worth a call to see if yours does. If you don't know where your local health department is, you can check in your phone book. The CDC also has a tool to help you locate your state's health department, and many state websites also have listings of all of their local health departments.  3) Find a Walk-in Clinic If your illness is not likely to be very severe or complicated, you may want to try a  walk in clinic. These are popping up all over the country in pharmacies, drugstores, and shopping centers. They're usually staffed by nurse practitioners or physician assistants that have been trained to treat common illnesses and complaints. They're usually fairly quick and inexpensive. However, if you have serious medical issues or chronic medical problems, these are probably not your best option.  No Primary Care Doctor: Call Health Connect at  [phone] - they can help you locate a primary care doctor that  accepts your insurance, provides certain services, etc. Physician  Referral [phone]  Emergency Department Resource Guide 1) Find a Doctor and Pay Out of Pocket Although you won't have to find out who is covered by your insurance plan, it is a good idea to ask around and get recommendations. You will then need to call the office and see if the doctor you have chosen will accept you as a new patient and what types of options they offer for patients who are self-pay. Some doctors offer discounts or will set up payment plans for their patients who do not have insurance, but you will need to ask so you aren't surprised when you get to your appointment.  2) Contact Your Local Health Department Not all health departments have doctors that can see patients for sick visits, but many do, so it is worth a call to see if yours does. If you don't know where your local health department is, you can check in your phone book. The CDC also has a tool to help you locate your state's health department, and many state websites also have listings of all of their local health departments.  3) Find a Walk-in Clinic If your illness is not likely to be very severe or complicated, you may want to try a walk in clinic. These are popping up all over the country in pharmacies, drugstores, and shopping centers. They're usually staffed by nurse practitioners or physician assistants that have been trained to treat common illnesses and complaints. They're usually fairly quick and inexpensive. However, if you have serious medical issues or chronic medical problems, these are probably not your best option.  No Primary Care Doctor: Call Health Connect at  [phone] - they can help you locate a primary care doctor that  accepts your insurance, provides certain services, etc. Physician Referral Service- [phone]  Chronic Pain Problems: Organization         Address  Phone   Notes  [facility]  [phone] Patients need to be referred by their primary care  doctor.   Medication Assistance: Organization         Address  Phone   Notes  [facility] Medication [facility] [address] [place] [place], [place] [address] [phone] --Must be a resident of [facility] -- Must have NO insurance coverage whatsoever (no Medicaid/ Medicare, etc.) -- The pt. MUST have a primary care doctor that directs their care regularly and follows them in the community   [facility]  [phone]   [employer]  [phone]    Agencies that provide inexpensive medical care: Organization         Address  Phone   Notes  [facility]  [phone]   [facility]    [phone]   [facility] [address] [place], [place] [address] [phone]   [facility] [address] [place], [place] [phone]   [facility]    [phone]   [facility]    [phone]   [facility]  [address], [place] Phone:  [phone], Fax:  [phone] Hours of Operation:  9 am - 6 pm, M-F.  Also accepts Medicaid/Medicare and self-pay.  [facility]  [address], [place] Phone: [phone], Fax: [phone]. Hours of Operation:  8:30 am - 5:30 pm, M-F.  Also accepts Medicaid and self-pay.  [facility] [place] [address] [place] Phone: [phone]   [facility] [address] Natasha Bence [place], [place] [phone], Ext. [phone] Mondays & Thursdays: 7-9 AM.  First 15 patients are seen on a first come, first serve basis.    Medicaid-accepting [facility] Providers:  Organization         Address  Phone   Notes  [facility] [address], [place] [phone] Also accepts self-pay patients.  [facility] [address] Laurell Josephs [place], [place]  [phone]   [facility] [address], [place] [phone]   [facility] [address] [place] [phone]   Renaye Rakers [address], [place]   [phone] Only accepts [place] patients after they have their name applied to their card.   Self-Pay (no insurance) in [facility]:  Organization         Address  Phone   Notes  Sickle Cell Patients, [facility] [address] [place], [place] [phone]   [facility] [address] [place], [place] [phone]   [facility] [place]  [address], [place] [phone]   [facility]/Dr. Osei-Bonsu  [address] [place] or [address], [place] [phone] Phone number for both [place] and [place] locations is the same.  [facility] and [facility] [address] [place] [phone]   [facility] [address], [place] or [address] [phone]   [facility] [address], [place] [phone], phone; [phone], fax Sees patients 1st and 3rd Saturday of every month.  Must not qualify for public or private insurance (i.e. Medicaid, Medicare, [place] [employer] Choice, Veterans' Benefits)  Household income should be no more than 200% of the poverty level The clinic cannot treat you if you are pregnant or think you are pregnant  Sexually transmitted diseases are not treated at the clinic.

## 2018-02-07 NOTE — ED Triage Notes (Signed)
Patient c/o cough, congestion, body aches, sore throat and headache since last night.

## 2018-02-07 NOTE — ED Notes (Signed)
Pt given ginger ale and able to keep it down.

## 2018-02-07 NOTE — ED Provider Notes (Signed)
[facility]-EMERGENCY DEPT Provider Note   CSN: [id number] Arrival date & time: 02/07/18  1820     History   Chief Complaint Chief Complaint  Patient presents with  . Cough  . Generalized Body Aches    HPI Micheal Garrison is a 27 y.o. male with a PMH of asthma presents with a fever, intermittent productive cough, congestion, sore throat, headache, and body aches onset yesterday. Patient states he has tried dayquil and ibuprofen with partial relief. Patient reports associated loss of appetite and vomiting. Patient reports light-headedness, but denies weakness or numbness. Patient denies abdominal pain, diarrhea, or neck pain. Patient denies chest pain or shortness of breath. Patient denies sick contacts and states he did not receive the influenza vaccine.   HPI  Past Medical History:  Diagnosis Date  . Asthma     Patient Active Problem List   Diagnosis Date Noted  . Pain from implanted hardware 08/13/2011  . TINEA PEDIS 04/20/2009  . OVERWEIGHT 04/20/2009  . DEPRESSION 04/20/2009  . ATTENTION DEFICIT HYPERACTIVITY DISORDER 04/20/2009  . ASTHMA 04/20/2009  . GERD 04/20/2009  . ACNE VULGARIS 04/20/2009  . ANEMIA, SECONDARY TO ACUTE BLOOD LOSS 03/30/2009  . HYPERGLYCEMIA 03/30/2009  . ELEVATED BLOOD PRESSURE WITHOUT DIAGNOSIS OF HYPERTENSION 03/30/2009  . 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 ONYCHOMYCOSIS, TOENAILS 10/14/2008    Past Surgical History:  Procedure Laterality Date  . HARDWARE REMOVAL  08/13/2011   Procedure: HARDWARE REMOVAL;  Surgeon: Kathryne Hitchhristopher Y Blackman, MD;  [facility];  Service: Orthopedics;  Laterality: Right;  Removal of distal interlocking screws right tibial nail  . LEG SURGERY          Home Medications    Prior to Admission medications   Medication Sig Start Date End Date Taking? Authorizing Provider  benzonatate (TESSALON) 100 MG capsule Take 1 capsule (100 mg total) by mouth every 8 (eight) hours.  02/07/18   Carlyle BasquesHernandez, Emad Brechtel P, PA-C  HYDROcodone-acetaminophen (HYCET) 7.5-325 mg/15 ml solution Take 15 mLs by mouth 4 (four) times daily as needed for moderate pain. Patient not taking: Reported on 02/07/2018 11/28/17 11/28/18  Bethel BornGekas, Kelly Marie, PA-C  naproxen (NAPROSYN) 500 MG tablet Take 1 tablet (500 mg total) by mouth 2 (two) times daily. 02/07/18   Leretha DykesHernandez, Darcus Edds P, PA-C    Family History Family History  Problem Relation Age of Onset  . Diabetes Mother   . Sickle cell trait Mother     Social History Social History   Tobacco Use  . Smoking status: Never Smoker  . Smokeless tobacco: Never Used  Substance Use Topics  . Alcohol use: Yes    Comment: occasionally  . Drug use: No     Allergies   Patient has no known allergies.   Review of Systems Review of Systems  Constitutional: Positive for appetite change, chills, diaphoresis and fever. Negative for activity change and fatigue.  HENT: Positive for congestion, rhinorrhea and sore throat. Negative for ear pain and postnasal drip.   Eyes: Negative for pain, redness, itching and visual disturbance.  Respiratory: Positive for cough. Negative for shortness of breath.   Cardiovascular: Negative for chest pain.  Gastrointestinal: Negative for abdominal pain, diarrhea, nausea and vomiting.  Genitourinary: Negative for dysuria.  Musculoskeletal: Positive for myalgias. Negative for gait problem and neck pain.  Skin: Negative for rash.  Allergic/Immunologic: Negative for environmental allergies and immunocompromised state.  Neurological: Positive for light-headedness. Negative for dizziness, weakness, numbness and headaches.  Physical Exam Updated Vital Signs BP (!) 132/91 (BP Location: Left Arm)   Pulse 92   Temp 99.8 F (37.7 C) (Oral)   Resp 18   Ht 6' (1.829 m)   SpO2 98%   BMI 32.56 kg/m   Physical Exam Vitals signs and nursing note reviewed.  Constitutional:      General: He is not in acute distress.     Appearance: He is well-developed. He is not diaphoretic.  HENT:     Head: Normocephalic and atraumatic.     Right Ear: Tympanic membrane, ear canal and external ear normal. No middle ear effusion.     Left Ear: Tympanic membrane, ear canal and external ear normal.  No middle ear effusion.     Nose: Mucosal edema, congestion and rhinorrhea present.     Mouth/Throat:     Pharynx: Uvula midline. Posterior oropharyngeal erythema present. No oropharyngeal exudate.  Eyes:     General:        Right eye: No discharge.        Left eye: No discharge.     Extraocular Movements: Extraocular movements intact.     Conjunctiva/sclera: Conjunctivae normal.     Pupils: Pupils are equal, round, and reactive to light.  Neck:     Musculoskeletal: Normal range of motion and neck supple.  Cardiovascular:     Rate and Rhythm: Normal rate and regular rhythm.     Heart sounds: Normal heart sounds. No murmur. No friction rub. No gallop.   Pulmonary:     Effort: Pulmonary effort is normal. No respiratory distress.     Breath sounds: Normal breath sounds. No wheezing or rales.  Abdominal:     Palpations: Abdomen is soft.     Tenderness: There is no abdominal tenderness.  Musculoskeletal: Normal range of motion.  Lymphadenopathy:     Cervical: No cervical adenopathy.  Skin:    General: Skin is warm.     Findings: No rash.  Neurological:     Mental Status: He is alert and oriented to person, place, and time.    Mental Status:  Alert, oriented, thought content appropriate, able to give a coherent history. Speech fluent without evidence of aphasia. Able to follow 2 step commands without difficulty.  Cranial Nerves:  II:  Peripheral visual fields grossly normal, pupils equal, round, reactive to light III,IV, VI: ptosis not present, extra-ocular motions intact bilaterally  V,VII: smile symmetric, facial light touch sensation equal VIII: hearing grossly normal to voice  X: uvula elevates symmetrically  XI:  bilateral shoulder shrug symmetric and strong XII: midline tongue extension without fassiculations Motor:  Normal tone. 5/5 in upper and lower extremities bilaterally including strong and equal grip strength and dorsiflexion/plantar flexion Sensory: light touch normal in all extremities.  Deep Tendon Reflexes: 2+ and symmetric in the biceps and patella Cerebellar: normal finger-to-nose with bilateral upper extremities Gait: normal gait and balance.  CV: distal pulses palpable throughout    ED Treatments / Results  Labs (all labs ordered are listed, but only abnormal results are displayed) Labs Reviewed  INFLUENZA PANEL BY PCR (TYPE A & B)    EKG None  Radiology No results found.  Procedures Procedures (including critical care time)  Medications Ordered in ED Medications  ondansetron (ZOFRAN-ODT) disintegrating tablet 4 mg (4 mg Oral Given 02/07/18 2033)  ibuprofen (ADVIL,MOTRIN) tablet 600 mg (600 mg Oral Given 02/07/18 2032)     Initial Impression / Assessment and Plan / ED Course  I have  reviewed the triage vital signs and the nursing notes.  Pertinent labs & imaging results that were available during my care of the patient were reviewed by me and considered in my medical decision making (see chart for details).    Patients symptoms are consistent with URI, likely viral etiology. Discussed that antibiotics are not indicated for viral infections. Do not suspect pneumonia at this time due to physical exam and length of symptoms. Doubt strep pharyngitis due to physical exam and centor score is 0. Ordered influenza swab due to history of asthma and length of symptoms. Influenza swab is negative. Pt will be discharged with symptomatic treatment.  Verbalizes understanding and is agreeable with plan. Pt is hemodynamically stable & in NAD prior to dc.   Final Clinical Impressions(s) / ED Diagnoses   Final diagnoses:  Viral URI with cough    ED Discharge Orders          Ordered    benzonatate (TESSALON) 100 MG capsule  Every 8 hours     02/07/18 2132    naproxen (NAPROSYN) 500 MG tablet  2 times daily     02/07/18 2132           Leretha Dykes, [place] 02/07/18 2133    Raeford Razor, MD 02/12/18 2241

## 2019-01-05 ENCOUNTER — Encounter (HOSPITAL_COMMUNITY): Payer: Self-pay

## 2019-01-05 ENCOUNTER — Emergency Department (HOSPITAL_COMMUNITY)
Admission: EM | Admit: 2019-01-05 | Discharge: 2019-01-05 | Disposition: A | Discharge status: Home / Self Care | Payer: Medicaid Other | Attending: Emergency Medicine | Admitting: Emergency Medicine

## 2019-01-05 ENCOUNTER — Other Ambulatory Visit: Payer: Self-pay

## 2019-01-05 DIAGNOSIS — B349 Viral infection, unspecified: Secondary | ICD-10-CM | POA: Insufficient documentation

## 2019-01-05 DIAGNOSIS — Z79899 Other long term (current) drug therapy: Secondary | ICD-10-CM | POA: Insufficient documentation

## 2019-01-05 DIAGNOSIS — J45909 Unspecified asthma, uncomplicated: Secondary | ICD-10-CM | POA: Insufficient documentation

## 2019-01-05 DIAGNOSIS — Z20828 Contact with and (suspected) exposure to other viral communicable diseases: Secondary | ICD-10-CM | POA: Insufficient documentation

## 2019-01-05 DIAGNOSIS — J029 Acute pharyngitis, unspecified: Secondary | ICD-10-CM

## 2019-01-05 LAB — GROUP A STREP BY PCR: Group A Strep by PCR: NOT DETECTED

## 2019-01-05 LAB — POC SARS CORONAVIRUS 2 AG -  ED: SARS Coronavirus 2 Ag: NEGATIVE

## 2019-01-05 MED ORDER — IBUPROFEN 100 MG/5ML PO SUSP: 600.0000 mg | Freq: Four times a day (QID) | ORAL | 0 refills | Status: DC | PRN

## 2019-01-05 NOTE — ED Notes (Signed)
Pt refused the send out COVID-19 test despite explanations that it would confirm the POC COVID test.  Dr. Roslynn Amble made aware.

## 2019-01-05 NOTE — ED Triage Notes (Signed)
Patient states he woke with a sore throat and has difficulty swallow due to pain.

## 2019-01-05 NOTE — Discharge Instructions (Addendum)
If you develop fever, neck swelling, facial redness or facial swelling, difficulty swallowing, please return to ER for reassessment.  Otherwise recommend follow-up with your primary doctor later this week for recheck.  Recommend Tylenol, Motrin as needed for pain control.  Please follow isolation precautions until we have the final results of your COVID-19 testing.  Hopefully this will return tomorrow.

## 2019-01-05 NOTE — ED Provider Notes (Signed)
[facility]-EMERGENCY DEPT Provider Note   CSN: [id number] Arrival date & time: 01/05/19  8099     History   Chief Complaint Chief Complaint  Patient presents with  . Sore Throat    HPI Micheal Garrison is a 28 y.o. male.  Presents to ER with sore throat.  Patient states symptoms first noted this morning, also feeling like he is having some throat swelling.  No fever.  No sick contacts.  Works at [employer].  Denies any medical problems.  States sore throat dull, ache, has not taken any medication for this.  Has been tolerating liquids without difficulty.  States solid food is painful and has avoided this so far this morning.     HPI  Past Medical History:  Diagnosis Date  . Asthma     Patient Active Problem List   Diagnosis Date Noted  . Pain from implanted hardware 08/13/2011  . TINEA PEDIS 04/20/2009  . OVERWEIGHT 04/20/2009  . DEPRESSION 04/20/2009  . ATTENTION DEFICIT HYPERACTIVITY DISORDER 04/20/2009  . ASTHMA 04/20/2009  . GERD 04/20/2009  . ACNE VULGARIS 04/20/2009  . ANEMIA, SECONDARY TO ACUTE BLOOD LOSS 03/30/2009  . HYPERGLYCEMIA 03/30/2009  . ELEVATED BLOOD PRESSURE WITHOUT DIAGNOSIS OF HYPERTENSION 03/30/2009  . 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 ONYCHOMYCOSIS, TOENAILS 10/14/2008    Past Surgical History:  Procedure Laterality Date  . HARDWARE REMOVAL  08/13/2011   Procedure: HARDWARE REMOVAL;  Surgeon: Kathryne Hitch, MD;  Location: [facility] OR;  Service: Orthopedics;  Laterality: Right;  Removal of distal interlocking screws right tibial nail  . LEG SURGERY          Home Medications    Prior to Admission medications   Medication Sig Start Date End Date Taking? Authorizing Provider  benzonatate (TESSALON) 100 MG capsule Take 1 capsule (100 mg total) by mouth every 8 (eight) hours. 02/07/18   Carlyle Basques P, PA-C  naproxen (NAPROSYN) 500 MG tablet Take 1 tablet (500 mg total) by mouth 2 (two) times daily.  02/07/18   Leretha Dykes, PA-C    Family History Family History  Problem Relation Age of Onset  . Diabetes Mother   . Sickle cell trait Mother     Social History Social History   Tobacco Use  . Smoking status: Never Smoker  . Smokeless tobacco: Never Used  Substance Use Topics  . Alcohol use: Yes    Comment: occasionally  . Drug use: No     Allergies   Patient has no known allergies.   Review of Systems Review of Systems  Constitutional: Negative for chills and fever.  HENT: Positive for sore throat. Negative for ear pain.   Eyes: Negative for pain and visual disturbance.  Respiratory: Negative for cough and shortness of breath.   Cardiovascular: Negative for chest pain and palpitations.  Gastrointestinal: Negative for abdominal pain and vomiting.  Genitourinary: Negative for dysuria and hematuria.  Musculoskeletal: Negative for arthralgias and back pain.  Skin: Negative for color change and rash.  Neurological: Negative for seizures and syncope.  All other systems reviewed and are negative.    Physical Exam Updated Vital Signs BP 132/88 (BP Location: Right Arm)   Pulse 70   Temp 98.2 F (36.8 C) (Oral)   Resp 14   Ht 6' (1.829 m)   Wt 104.3 kg   SpO2 99%   BMI 31.19 kg/m   Physical Exam Vitals signs and nursing note reviewed.  Constitutional:  Appearance: He is well-developed.  HENT:     Head: Normocephalic and atraumatic.     Nose: No congestion.     Mouth/Throat:     Mouth: Mucous membranes are moist.     Pharynx: Oropharynx is clear. No oropharyngeal exudate or posterior oropharyngeal erythema.     Tonsils: No tonsillar exudate or tonsillar abscesses.  Eyes:     Conjunctiva/sclera: Conjunctivae normal.  Neck:     Musculoskeletal: Neck supple.  Cardiovascular:     Rate and Rhythm: Normal rate and regular rhythm.     Heart sounds: No murmur.  Pulmonary:     Effort: Pulmonary effort is normal. No respiratory distress.     Breath  sounds: Normal breath sounds.  Abdominal:     Palpations: Abdomen is soft.     Tenderness: There is no abdominal tenderness.  Lymphadenopathy:     Cervical: Cervical adenopathy present.  Skin:    General: Skin is warm and dry.     Capillary Refill: Capillary refill takes less than 2 seconds.  Neurological:     General: No focal deficit present.     Mental Status: He is alert.  Psychiatric:        Mood and Affect: Mood normal.        Behavior: Behavior normal.      ED Treatments / Results  Labs (all labs ordered are listed, but only abnormal results are displayed) Labs Reviewed - No data to display  EKG None  Radiology No results found.  Procedures Procedures (including critical care time)  Medications Ordered in ED Medications - No data to display   Initial Impression / Assessment and Plan / ED Course  I have reviewed the triage vital signs and the nursing notes.  Pertinent labs & imaging results that were available during my care of the patient were reviewed by me and considered in my medical decision making (see chart for details).       28 year old male with sore throat since this morning.  On exam very well-appearing, no significant exudates appreciated, no PTA, normal neck range of motion, no neck stiffness, no abscess or erythema appreciated on neck or oropharynx exam.  Will check for rapid strep.  If negative, most likely acute viral pharyngitis.  Tolerating p.o., appropriate for outpatient management discharge at this time.  Will check for COVID-19 and recommend isolation precautions until PCR test has come back likely tomorrow.  After the discussed management above, the patient was determined to be safe for discharge.  The patient was in agreement with this plan and all questions regarding their care were answered.  ED return precautions were discussed and the patient will return to the ED with any significant worsening of condition.   Final Clinical  Impressions(s) / ED Diagnoses   Final diagnoses:  Viral pharyngitis  Acute viral syndrome    ED Discharge Orders    None       Lucrezia Starch, MD 01/05/19 1047

## 2019-02-15 ENCOUNTER — Other Ambulatory Visit: Payer: Self-pay

## 2019-02-15 ENCOUNTER — Emergency Department (HOSPITAL_COMMUNITY)
Admission: EM | Admit: 2019-02-15 | Discharge: 2019-02-16 | Discharge status: Against Medical Advice | Payer: Medicaid Other

## 2019-04-28 ENCOUNTER — Encounter (HOSPITAL_COMMUNITY): Payer: Self-pay | Admitting: Emergency Medicine

## 2019-04-28 ENCOUNTER — Other Ambulatory Visit: Payer: Self-pay

## 2019-04-28 ENCOUNTER — Emergency Department (HOSPITAL_COMMUNITY)
Admission: EM | Admit: 2019-04-28 | Discharge: 2019-04-28 | Discharge status: Against Medical Advice | Payer: Medicaid Other | Attending: Emergency Medicine | Admitting: Emergency Medicine

## 2019-04-28 DIAGNOSIS — Z5321 Procedure and treatment not carried out due to patient leaving prior to being seen by health care provider: Secondary | ICD-10-CM | POA: Insufficient documentation

## 2019-04-28 NOTE — ED Notes (Signed)
Patient called for room placement x3 with no answer. 

## 2019-04-28 NOTE — ED Triage Notes (Signed)
Patient called for room placement x1 with no answer. 

## 2019-04-28 NOTE — ED Triage Notes (Signed)
Patient c/o fatigue and dizziness since last night. Reports heavy drinking x2 days due to death in the family. Reports "I think I am just dehydrated." Denies abdominal pain, N/V/D, chest pain and SOB. Ambulatory.

## 2019-04-28 NOTE — ED Notes (Signed)
Patient called for room placement x2 with no answer. 

## 2019-07-14 ENCOUNTER — Emergency Department (HOSPITAL_COMMUNITY)
Admission: EM | Admit: 2019-07-14 | Discharge: 2019-07-14 | Disposition: A | Discharge status: Home / Self Care | Payer: Medicaid Other | Attending: Emergency Medicine | Admitting: Emergency Medicine

## 2019-07-14 DIAGNOSIS — M549 Dorsalgia, unspecified: Secondary | ICD-10-CM | POA: Insufficient documentation

## 2019-07-14 DIAGNOSIS — Z5321 Procedure and treatment not carried out due to patient leaving prior to being seen by health care provider: Secondary | ICD-10-CM | POA: Insufficient documentation

## 2019-07-14 DIAGNOSIS — R519 Headache, unspecified: Secondary | ICD-10-CM | POA: Insufficient documentation

## 2019-07-14 NOTE — ED Notes (Signed)
Pt called for room x 2. No response 

## 2019-07-14 NOTE — ED Triage Notes (Addendum)
Arrived during down time. Reports he tripped and fell at work, reports back pain and head pain*. Ambulatory. VSS.

## 2019-09-23 ENCOUNTER — Emergency Department (HOSPITAL_COMMUNITY)
Admission: EM | Admit: 2019-09-23 | Discharge: 2019-09-23 | Disposition: A | Discharge status: Home / Self Care | Payer: Self-pay | Attending: Emergency Medicine | Admitting: Emergency Medicine

## 2019-09-23 ENCOUNTER — Encounter (HOSPITAL_COMMUNITY): Payer: Self-pay

## 2019-09-23 ENCOUNTER — Emergency Department (HOSPITAL_COMMUNITY): Payer: Self-pay

## 2019-09-23 ENCOUNTER — Other Ambulatory Visit: Payer: Self-pay

## 2019-09-23 DIAGNOSIS — W010XXA Fall on same level from slipping, tripping and stumbling without subsequent striking against object, initial encounter: Secondary | ICD-10-CM | POA: Insufficient documentation

## 2019-09-23 DIAGNOSIS — Y998 Other external cause status: Secondary | ICD-10-CM | POA: Insufficient documentation

## 2019-09-23 DIAGNOSIS — Y92009 Unspecified place in unspecified non-institutional (private) residence as the place of occurrence of the external cause: Secondary | ICD-10-CM | POA: Insufficient documentation

## 2019-09-23 DIAGNOSIS — Y9389 Activity, other specified: Secondary | ICD-10-CM | POA: Insufficient documentation

## 2019-09-23 DIAGNOSIS — I10 Essential (primary) hypertension: Secondary | ICD-10-CM | POA: Insufficient documentation

## 2019-09-23 DIAGNOSIS — S8391XA Sprain of unspecified site of right knee, initial encounter: Secondary | ICD-10-CM | POA: Insufficient documentation

## 2019-09-23 DIAGNOSIS — Z79899 Other long term (current) drug therapy: Secondary | ICD-10-CM | POA: Insufficient documentation

## 2019-09-23 DIAGNOSIS — J45909 Unspecified asthma, uncomplicated: Secondary | ICD-10-CM | POA: Insufficient documentation

## 2019-09-23 MED ORDER — IBUPROFEN 600 MG PO TABS: 600.0000 mg | ORAL_TABLET | Freq: Four times a day (QID) | ORAL | 0 refills | Status: DC | PRN

## 2019-09-23 MED ORDER — IBUPROFEN 800 MG PO TABS
800.0000 mg | ORAL_TABLET | Freq: Once | ORAL | Status: AC
  Administered 2019-09-23: 800 mg via ORAL
  Filled 2019-09-23: qty 1

## 2019-09-23 MED ORDER — CYCLOBENZAPRINE HCL 10 MG PO TABS: 10.0000 mg | ORAL_TABLET | Freq: Two times a day (BID) | ORAL | 0 refills | Status: DC | PRN

## 2019-09-23 NOTE — ED Provider Notes (Signed)
[facility]-EMERGENCY DEPT Provider Note   CSN: [id number] Arrival date & time: 09/23/19  1123     History Chief Complaint  Patient presents with  . Leg Pain    Micheal Garrison is a 29 y.o. male.  The history is provided by the patient. No language interpreter was used.  Leg Pain Associated symptoms: no fever      29 year old male presenting for evaluation of a fall.  Patient report last night he was sitting on his porch rounding, lost balance, fell backward and landed on his right knee.  He report acute onset of sharp throbbing pain about the knee radiates towards his right thigh.  Pain is moderate to severe, worse with palpation or with movement.  Denies hitting his head or loss of consciousness, denies any significant back pain hip pain or ankle pain.  He has not taken anything to relieve his pain.  He denies any associated numbness and denies any precipitating symptoms prior to the fall.  He report difficulty ambulating secondary to the pain and increasing pain with flexion of the knee.  Past Medical History:  Diagnosis Date  . Asthma     Patient Active Problem List   Diagnosis Date Noted  . Pain from implanted hardware 08/13/2011  . TINEA PEDIS 04/20/2009  . OVERWEIGHT 04/20/2009  . DEPRESSION 04/20/2009  . ATTENTION DEFICIT HYPERACTIVITY DISORDER 04/20/2009  . ASTHMA 04/20/2009  . GERD 04/20/2009  . ACNE VULGARIS 04/20/2009  . ANEMIA, SECONDARY TO ACUTE BLOOD LOSS 03/30/2009  . HYPERGLYCEMIA 03/30/2009  . ELEVATED BLOOD PRESSURE WITHOUT DIAGNOSIS OF HYPERTENSION 03/30/2009  . 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 ONYCHOMYCOSIS, TOENAILS 10/14/2008    Past Surgical History:  Procedure Laterality Date  . HARDWARE REMOVAL  08/13/2011   Procedure: HARDWARE REMOVAL;  Surgeon: Kathryne Hitch, MD;  Location: [facility] OR;  Service: Orthopedics;  Laterality: Right;  Removal of distal interlocking screws right tibial nail  . LEG SURGERY          Family History  Problem Relation Age of Onset  . Diabetes Mother   . Sickle cell trait Mother     Social History   Tobacco Use  . Smoking status: Never Smoker  . Smokeless tobacco: Never Used  Vaping Use  . Vaping Use: Never used  Substance Use Topics  . Alcohol use: Yes    Comment: occasionally  . Drug use: No    Home Medications Prior to Admission medications   Medication Sig Start Date End Date Taking? Authorizing Provider  benzonatate (TESSALON) 100 MG capsule Take 1 capsule (100 mg total) by mouth every 8 (eight) hours. 02/07/18   Carlyle Basques P, PA-C  ibuprofen (ADVIL) 100 MG/5ML suspension Take 30 mLs (600 mg total) by mouth every 6 (six) hours as needed for mild pain. 01/05/19   Milagros Loll, MD  naproxen (NAPROSYN) 500 MG tablet Take 1 tablet (500 mg total) by mouth 2 (two) times daily. 02/07/18   Leretha Dykes, PA-C    Allergies    Patient has no known allergies.  Review of Systems   Review of Systems  Constitutional: Negative for fever.  Musculoskeletal: Positive for arthralgias.  Neurological: Negative for numbness.    Physical Exam Updated Vital Signs BP (!) 133/92 (BP Location: Right Arm)   Pulse 91   Temp 98.2 F (36.8 C) (Oral)   Resp 20   Ht 6' (1.829 m)   Wt 122.5 kg   SpO2 96%  BMI 36.62 kg/m   Physical Exam Vitals and nursing note reviewed.  Constitutional:      General: He is not in acute distress.    Appearance: He is well-developed.  HENT:     Head: Atraumatic.  Eyes:     Conjunctiva/sclera: Conjunctivae normal.  Musculoskeletal:        General: Tenderness (Right lower extremity: Point tenderness to lateral aspect of right knee with small abrasion noted.  Decreased flexion or extension secondary to pain but no joint laxity appreciated.  No obvious deformity.  Patella located.-) present.     Cervical back: Neck supple.     Comments: Right hip and right ankle nontender to palpation.  Dorsalis pedis pulse  palpable.  Skin:    Findings: No rash.  Neurological:     Mental Status: He is alert.     ED Results / Procedures / Treatments   Labs (all labs ordered are listed, but only abnormal results are displayed) Labs Reviewed - No data to display  EKG None  Radiology DG Knee Complete 4 Views Right  Result Date: 09/23/2019 CLINICAL DATA:  Larey Seat from porch last night, severe RIGHT knee and leg pain, cannot bear weight EXAM: RIGHT KNEE - COMPLETE 4+ VIEW COMPARISON:  04/07/2009 FINDINGS: IM nail in RIGHT tibia, prior diaphyseal fracture beyond extent of knee imaging. Osseous mineralization normal. Joint spaces preserved. No acute fracture, dislocation, or bone destruction. No knee joint effusion. IMPRESSION: No acute abnormalities. Electronically Signed   By: Ulyses Southward M.D.   On: 09/23/2019 12:27    Procedures Procedures (including critical care time)  Medications Ordered in ED Medications  ibuprofen (ADVIL) tablet 800 mg (800 mg Oral Given 09/23/19 1159)    ED Course  I have reviewed the triage vital signs and the nursing notes.  Pertinent labs & imaging results that were available during my care of the patient were reviewed by me and considered in my medical decision making (see chart for details).    MDM Rules/Calculators/A&P                          BP (!) 133/92 (BP Location: Right Arm)   Pulse 91   Temp 98.2 F (36.8 C) (Oral)   Resp 20   Ht 6' (1.829 m)   Wt 122.5 kg   SpO2 96%   BMI 36.62 kg/m   Final Clinical Impression(s) / ED Diagnoses Final diagnoses:  Sprain of right knee, unspecified ligament, initial encounter    Rx / DC Orders ED Discharge Orders         Ordered    ibuprofen (ADVIL) 600 MG tablet  Every 6 hours PRN     Discontinue  Reprint     09/23/19 1313    cyclobenzaprine (FLEXERIL) 10 MG tablet  2 times daily PRN     Discontinue  Reprint     09/23/19 1313         1:16 PM Patient fell off the porch approximately 3 foot in height and injured  his right lower extremity.  Point tenderness to lateral aspect of right knee.  Small abrasion noted.  X-ray of the right knee unremarkable.  Patient is neurovascular intact.  Knee sleeves and crutches provided.  Rice therapy discussed.  Orthopedic referral given as needed.  Return precaution discussed.   Fayrene Helper, PA-C 09/23/19 1316    Bethann Berkshire, MD 09/23/19 [phone]

## 2019-09-23 NOTE — Discharge Instructions (Signed)
You have been evaluated for your injury.  Fortunately your x-ray did not show any broken bone.  Wear the knee brace for support, use crutches as needed to help you ambulate.  Follow instruction below.  Follow-up with orthopedist if you notice no improvement.

## 2019-09-23 NOTE — ED Triage Notes (Signed)
Pt reports falling off the porch onto his right side. Pt now endorses right upper leg and knee pain. Pt reports prior hx of injury to right leg.

## 2019-09-23 NOTE — Progress Notes (Signed)
Orthopedic Tech Progress Note Patient Details:  Micheal Garrison 28-Oct-1990 [id number]  Ortho Devices Ortho Device/Splint Location: RLE knee sleeve and crutches Ortho Device/Splint Interventions: Ordered, Application, Adjustment   Post Interventions Patient Tolerated: Well Instructions Provided: Care of device   [name] 09/23/2019, 12:49 PM

## 2019-09-28 ENCOUNTER — Emergency Department (HOSPITAL_COMMUNITY): Payer: Self-pay

## 2019-09-28 ENCOUNTER — Encounter (HOSPITAL_COMMUNITY): Payer: Self-pay

## 2019-09-28 ENCOUNTER — Emergency Department (HOSPITAL_COMMUNITY)
Admission: EM | Admit: 2019-09-28 | Discharge: 2019-09-29 | Disposition: A | Discharge status: Home / Self Care | Payer: Self-pay | Attending: Emergency Medicine | Admitting: Emergency Medicine

## 2019-09-28 ENCOUNTER — Other Ambulatory Visit: Payer: Self-pay

## 2019-09-28 DIAGNOSIS — M791 Myalgia, unspecified site: Secondary | ICD-10-CM | POA: Insufficient documentation

## 2019-09-28 DIAGNOSIS — Z20822 Contact with and (suspected) exposure to covid-19: Secondary | ICD-10-CM | POA: Insufficient documentation

## 2019-09-28 DIAGNOSIS — R0602 Shortness of breath: Secondary | ICD-10-CM | POA: Insufficient documentation

## 2019-09-28 DIAGNOSIS — R509 Fever, unspecified: Secondary | ICD-10-CM | POA: Insufficient documentation

## 2019-09-28 DIAGNOSIS — Z5321 Procedure and treatment not carried out due to patient leaving prior to being seen by health care provider: Secondary | ICD-10-CM | POA: Insufficient documentation

## 2019-09-28 LAB — SARS CORONAVIRUS 2 BY RT PCR (HOSPITAL ORDER, PERFORMED IN ~~LOC~~ HOSPITAL LAB): SARS Coronavirus 2: NEGATIVE

## 2019-09-28 MED ORDER — ACETAMINOPHEN 325 MG PO TABS
650.0000 mg | ORAL_TABLET | Freq: Once | ORAL | Status: AC | PRN
  Administered 2019-09-28: 650 mg via ORAL
  Filled 2019-09-28: qty 2

## 2019-09-28 NOTE — ED Notes (Signed)
Patient told registration he was having chest pain in the lobby but resolved when he came to triage.

## 2019-09-28 NOTE — ED Triage Notes (Signed)
Patient arrived stating that today he began feeling bad with generalized body aches and a fever of 103. Reports feeling shortness of breath when walking but stating "its not bad". Declines taking any otc medication.

## 2019-09-28 NOTE — ED Notes (Addendum)
Patient verbalized displeasure in having to wait in the lobby. This nurse explained that unfortunately we do have a wait and we cannot have him stay in triage, that we use these rooms to check in patients and I understand that he is frustrated but we are trying to get people seen as soon as possible.

## 2019-09-29 ENCOUNTER — Ambulatory Visit (HOSPITAL_COMMUNITY): Payer: Medicaid Other

## 2019-09-29 NOTE — ED Notes (Signed)
Called pt x3 for recheck of vitals and no response  

## 2021-10-03 ENCOUNTER — Encounter (HOSPITAL_COMMUNITY): Payer: Self-pay

## 2021-10-03 ENCOUNTER — Emergency Department (HOSPITAL_COMMUNITY)
Admission: EM | Admit: 2021-10-03 | Discharge: 2021-10-03 | Disposition: A | Discharge status: Home / Self Care | Payer: Medicaid Other | Attending: Emergency Medicine | Admitting: Emergency Medicine

## 2021-10-03 ENCOUNTER — Other Ambulatory Visit: Payer: Self-pay

## 2021-10-03 DIAGNOSIS — F1193 Opioid use, unspecified with withdrawal: Secondary | ICD-10-CM | POA: Insufficient documentation

## 2021-10-03 NOTE — Discharge Instructions (Signed)
You were seen today for concerns over opiate withdrawals. Your examination was reassuring for no signs of significant withdrawal at this time. Please follow up as planned with rehab tomorrow. If you develop life threatening withdrawal symptoms please return to the emergency department for further evaluation
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
## 2021-10-03 NOTE — ED Provider Notes (Signed)
[facility]-EMERGENCY DEPT Provider Note   CSN: [id number] Arrival date & time: 10/03/21  7829     History  Chief Complaint  Patient presents with   Withdrawal    Micheal Garrison is a 31 y.o. male.  Patient presents emergency department today for requesting Suboxone.  Patient states that he has been taking narcotic pain medication both prescribed and illicitly since he was 31 years old.  The patient states that he had some family was recently have poor outcomes due to the same he decided to quit.  He states that he has an appointment with a rehab starting tomorrow.  He states that his last pill was approximately 2 days ago.  He denies shortness of breath, chest pain, nausea, vomiting, abdominal pain, headache.  He is requesting Suboxone to get him through from today until tomorrow.  Patient has past medical history significant for leg surgery when he was 20 or 21 with subsequent hardware removal in 2013, and asthma  HPI     Home Medications Prior to Admission medications   Medication Sig Start Date End Date Taking? Authorizing Provider  benzonatate (TESSALON) 100 MG capsule Take 1 capsule (100 mg total) by mouth every 8 (eight) hours. 02/07/18   Carlyle Basques P, PA-C  cyclobenzaprine (FLEXERIL) 10 MG tablet Take 1 tablet (10 mg total) by mouth 2 (two) times daily as needed for muscle spasms. 09/23/19   Fayrene Helper, PA-C  ibuprofen (ADVIL) 600 MG tablet Take 1 tablet (600 mg total) by mouth every 6 (six) hours as needed. 09/23/19   Fayrene Helper, PA-C  naproxen (NAPROSYN) 500 MG tablet Take 1 tablet (500 mg total) by mouth 2 (two) times daily. 02/07/18   Leretha Dykes, PA-C      Allergies    Patient has no known allergies.    Review of Systems   Review of Systems  Constitutional:  Negative for fever.  Respiratory:  Negative for shortness of breath.   Cardiovascular:  Negative for chest pain.  Gastrointestinal:  Negative for abdominal pain, nausea and  vomiting.  Genitourinary:  Negative for dysuria.  Musculoskeletal:  Negative for arthralgias.  Neurological:  Positive for tremors (Mild). Negative for headaches.    Physical Exam Updated Vital Signs BP (!) 131/95 (BP Location: Right Arm)   Pulse 79   Temp 98 F (36.7 C) (Oral)   Resp 18   SpO2 100%  Physical Exam Vitals and nursing note reviewed.  Constitutional:      General: He is not in acute distress. HENT:     Head: Normocephalic and atraumatic.     Mouth/Throat:     Mouth: Mucous membranes are moist.  Eyes:     Conjunctiva/sclera: Conjunctivae normal.     Pupils: Pupils are equal, round, and reactive to light.  Cardiovascular:     Rate and Rhythm: Normal rate and regular rhythm.     Pulses: Normal pulses.     Heart sounds: Normal heart sounds.  Pulmonary:     Effort: Pulmonary effort is normal.     Breath sounds: Normal breath sounds.  Abdominal:     Palpations: Abdomen is soft.     Tenderness: There is no abdominal tenderness.  Musculoskeletal:        General: Normal range of motion.     Cervical back: Normal range of motion and neck supple.  Skin:    General: Skin is warm and dry.  Neurological:     General: No focal deficit present.  Mental Status: He is alert and oriented to person, place, and time.     Comments: Very mild tremors noted     ED Results / Procedures / Treatments   Labs (all labs ordered are listed, but only abnormal results are displayed) Labs Reviewed - No data to display  EKG None  Radiology No results found.  Procedures Procedures    Medications Ordered in ED Medications - No data to display  ED Course/ Medical Decision Making/ A&P                           Medical Decision Making  The patient presents to the hospital with chief complaint of withdrawal.  The patient's COWS score is 3 at this time showing very low acuity withdrawal symptoms.  There is no reason at this time to initiate Suboxone therapy.  The patient  has no respiratory distress.  No signs of significant withdrawal symptoms.  He does have follow-up planned with rehab tomorrow.  The patient has no physical complaints at this time that would require imaging or lab work to be completed.  There is no indication for admission.  Follow-up precautions have been provided including return for worsening withdrawal symptoms         Final Clinical Impression(s) / ED Diagnoses Final diagnoses:  Opioid withdrawal [facility])    Rx / DC Orders ED Discharge Orders     None         Pamala Duffel 10/03/21 1430    Glyn Ade, MD 10/03/21 [phone]

## 2021-10-03 NOTE — ED Triage Notes (Signed)
Pt endorses nausea, fatigue, and feeling generally unwell. 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 He is requesting suboxone to hold him over until tomorrow.

## 2022-01-21 ENCOUNTER — Telehealth: Payer: Self-pay | Admitting: Orthopaedic Surgery

## 2022-01-21 NOTE — Telephone Encounter (Signed)
Received vm from patient. Micheal Garrison [phone]

## 2022-02-13 ENCOUNTER — Ambulatory Visit: Payer: Medicaid Other | Admitting: Orthopaedic Surgery

## 2022-07-26 ENCOUNTER — Ambulatory Visit (HOSPITAL_COMMUNITY)
Admission: EM | Admit: 2022-07-26 | Discharge: 2022-07-26 | Disposition: A | Discharge status: Home / Self Care | Payer: Medicaid Other | Attending: Physician Assistant | Admitting: Physician Assistant

## 2022-07-26 ENCOUNTER — Encounter (HOSPITAL_COMMUNITY): Payer: Self-pay | Admitting: Emergency Medicine

## 2022-07-26 DIAGNOSIS — K0889 Other specified disorders of teeth and supporting structures: Secondary | ICD-10-CM

## 2022-07-26 DIAGNOSIS — K047 Periapical abscess without sinus: Secondary | ICD-10-CM | POA: Diagnosis not present

## 2022-07-26 MED ORDER — LIDOCAINE VISCOUS HCL 2 % MT SOLN: 15.0000 mL | Freq: Three times a day (TID) | OROMUCOSAL | 0 refills | Status: AC | PRN

## 2022-07-26 MED ORDER — IBUPROFEN 800 MG PO TABS: 800.0000 mg | ORAL_TABLET | Freq: Three times a day (TID) | ORAL | 0 refills | Status: AC

## 2022-07-26 MED ORDER — AMOXICILLIN-POT CLAVULANATE 875-125 MG PO TABS: 1.0000 | ORAL_TABLET | Freq: Two times a day (BID) | ORAL | 0 refills | Status: DC

## 2022-07-26 NOTE — ED Provider Notes (Signed)
[facility]    CSN: [id number] Arrival date & time: 07/26/22  1755      History   Chief Complaint Chief Complaint  Patient presents with   Dental Pain    HPI Micheal Garrison is a 32 y.o. male.   Patient presents today with a month-long history of right dental pain that is significantly worsened in the past week.  Reports that pain is rated 10 on a 0-10 pain scale, described as throbbing, worse with mastication, no alleviating factors identified.  He has not seen a dentist recently denies any recent dental procedure.  He has tried ibuprofen, Goody powders, Orajel without improvement of symptoms.  He is able to eat and drink despite symptoms.  He denies any swelling of his throat, shortness of breath, muffled voice, fever, nausea, vomiting.  Denies any recent antibiotic use.    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 Authorizing Provider  amoxicillin-clavulanate (AUGMENTIN) 875-125 MG tablet Take 1 tablet by mouth every 12 (twelve) hours. 07/26/22   Yes Zi Sek K, PA-C  ibuprofen (ADVIL) 800 MG tablet Take 1 tablet (800 mg total) by mouth 3 (three) times daily. 07/26/22  Yes Mirabel Ahlgren K, PA-C  lidocaine (XYLOCAINE) 2 % solution Use as directed 15 mLs in the mouth or throat every 8 (eight) hours as needed for mouth pain. Swish and spit 07/26/22  Yes Jaquavius Hudler, Noberto Retort, PA-C    Family History Family History  Problem Relation Age of Onset   Diabetes Mother    Sickle cell trait Mother     Social History Social History   Tobacco Use   Smoking status: Never   Smokeless tobacco: Never  Vaping Use   Vaping Use: Never used  Substance Use Topics   Alcohol use: Yes    Comment: occasionally   Drug use: No     Allergies   Patient has no known allergies.   Review of Systems Review of Systems  Constitutional:  Negative for activity change, appetite change, fatigue and fever.  HENT:  Positive for dental problem, ear pain and facial swelling. Negative for congestion, sore throat, trouble swallowing and voice change.   Gastrointestinal:  Negative for abdominal pain, diarrhea, nausea and vomiting.  Musculoskeletal:  Negative for arthralgias and myalgias.     Physical Exam Triage Vital Signs ED Triage Vitals  Enc Vitals Group     BP 07/26/22 1850 111/75     Pulse Rate 07/26/22 1850 79     Resp 07/26/22 1850 17  Temp 07/26/22 1850 98.2 F (36.8 C)     Temp Source 07/26/22 1850 Oral     SpO2 07/26/22 1850 97 %     Weight --      Height --      Head Circumference --      Peak Flow --      Pain Score 07/26/22 1849 10     Pain Loc --      Pain Edu? --      Excl. in GC? --    No data found.  Updated Vital Signs BP 111/75 (BP Location: Right Arm)   Pulse 79   Temp 98.2 F (36.8 C) (Oral)   Resp 17   SpO2 97%   Visual Acuity Right Eye Distance:   Left Eye Distance:   Bilateral Distance:    Right Eye Near:   Left Eye Near:    Bilateral Near:     Physical Exam Vitals reviewed.  Constitutional:       General: He is awake.     Appearance: Normal appearance. He is well-developed. He is not ill-appearing.     Comments: Very pleasant male appears stated age in no acute distress and comfortable in exam room  HENT:     Head: Normocephalic and atraumatic.     Right Ear: Tympanic membrane, ear canal and external ear normal. Tympanic membrane is not erythematous or bulging.     Left Ear: Tympanic membrane, ear canal and external ear normal. Tympanic membrane is not erythematous or bulging.     Nose: Nose normal.     Mouth/Throat:     Dentition: Dental tenderness and dental abscesses present.     Pharynx: Uvula midline. Posterior oropharyngeal erythema present. No oropharyngeal exudate.      Comments: No evidence of Ludwig angina Cardiovascular:     Rate and Rhythm: Normal rate and regular rhythm.     Heart sounds: Normal heart sounds, S1 normal and S2 normal. No murmur heard. Pulmonary:     Effort: Pulmonary effort is normal. No accessory muscle usage or respiratory distress.     Breath sounds: Normal breath sounds. No stridor. No wheezing, rhonchi or rales.     Comments: Clear to auscultation bilaterally Abdominal:     General: Bowel sounds are normal.     Palpations: Abdomen is soft.     Tenderness: There is no abdominal tenderness.  Neurological:     Mental Status: He is alert.  Psychiatric:        Behavior: Behavior is cooperative.      UC Treatments / Results  Labs (all labs ordered are listed, but only abnormal results are displayed) Labs Reviewed - No data to display  EKG   Radiology No results found.  Procedures Procedures (including critical care time)  Medications Ordered in UC Medications - No data to display  Initial Impression / Assessment and Plan / UC Course  I have reviewed the triage vital signs and the nursing notes.  Pertinent labs & imaging results that were available during my care of the patient were reviewed by me and considered in my medical  decision making (see chart for details).     Patient is well-appearing, afebrile, nontoxic, nontachycardic.  No indication for emergent evaluation or imaging.  Patient was treated for dental infection with Augmentin twice daily for 10 days.  He was given ibuprofen 800 mg for pain relief with instruction to take this with food to prevent GI upset.  He is to avoid  use of additional NSAIDs.  Can use Tylenol for breakthrough pain.  He was given viscous lidocaine for additional symptom relief but discussed that he is not to eat or drink immediately after using this medication due to risk of choking.  Recommend he gargle with warm salt water for additional symptom relief.  Discussed that ultimately he will need to see dentist to address underlying tooth.  He was provided low-cost dental resources in the area with after visit summary.  If he develops any worsening symptoms including fever, nausea, vomiting, swelling of his throat, muffled voice, dysphagia he needs to go to the emergency room to which he expressed understanding.  Work excuse note provided.   Final Clinical Impressions(s) / UC Diagnoses   Final diagnoses:  Dental infection  Pain, dental     Discharge Instructions      Start Augmentin twice daily for 7 days.  Use ibuprofen 800 mg up to 3 times a day for pain relief.  You should avoid NSAIDs including aspirin, ibuprofen/Advil, naproxen/Aleve with this medication as it causes stomach bleeding.  You can use Tylenol for breakthrough pain.  You can use viscous lidocaine for additional symptom relief.  Do not eat or drink immediately after using this medication as it will cause an increased risk of choking.  Gargle with warm salt water for additional symptom relief.  You should follow-up with dentist; call to schedule an appointment.  If you develop any worsening symptoms including difficulty swallowing, difficulty speaking, swelling of your throat, high fever, change in your voice you need to be  seen immediately.      ED Prescriptions     Medication Sig Dispense Auth. Provider   ibuprofen (ADVIL) 800 MG tablet Take 1 tablet (800 mg total) by mouth 3 (three) times daily. 21 tablet Aailyah Dunbar K, PA-C   amoxicillin-clavulanate (AUGMENTIN) 875-125 MG tablet Take 1 tablet by mouth every 12 (twelve) hours. 20 tablet Asucena Galer K, PA-C   lidocaine (XYLOCAINE) 2 % solution Use as directed 15 mLs in the mouth or throat every 8 (eight) hours as needed for mouth pain. Swish and spit 100 mL Jaben Benegas K, PA-C      I have reviewed the PDMP during this encounter.   Jeani Hawking, PA-C 07/26/22 1907

## 2022-07-26 NOTE — ED Triage Notes (Signed)
Pt c/o bilateral right and left dental pain for over a month. Worse on left side. Tried oral-gel and Ibuprofen 800 mg with out relief.

## 2022-07-26 NOTE — Discharge Instructions (Addendum)
Start Augmentin twice daily for 7 days.  Use ibuprofen 800 mg up to 3 times a day for pain relief.  You should avoid NSAIDs including aspirin, ibuprofen/Advil, naproxen/Aleve with this medication as it causes stomach bleeding.  You can use Tylenol for breakthrough pain.  You can use viscous lidocaine for additional symptom relief.  Do not eat or drink immediately after using this medication as it will cause an increased risk of choking.  Gargle with warm salt water for additional symptom relief.  You should follow-up with dentist; call to schedule an appointment.  If you develop any worsening symptoms including difficulty swallowing, difficulty speaking, swelling of your throat, high fever, change in your voice you need to be seen immediately.

## 2022-08-10 ENCOUNTER — Telehealth: Payer: Self-pay | Admitting: Student

## 2022-08-10 NOTE — Telephone Encounter (Signed)
Received an after-hours page to call patient.  Call the number provided on the page and also patient's phone number and mother's phone number but unable to reach anyone.  Left a HIPAA compliant voicemail with plans to call back in 20 minutes.

## 2022-08-27 ENCOUNTER — Other Ambulatory Visit: Payer: Self-pay

## 2022-08-27 ENCOUNTER — Emergency Department (HOSPITAL_COMMUNITY)
Admission: EM | Admit: 2022-08-27 | Discharge: 2022-08-27 | Disposition: A | Discharge status: Home / Self Care | Payer: Medicaid Other | Attending: Emergency Medicine | Admitting: Emergency Medicine

## 2022-08-27 DIAGNOSIS — K0889 Other specified disorders of teeth and supporting structures: Secondary | ICD-10-CM | POA: Diagnosis not present

## 2022-08-27 DIAGNOSIS — R0981 Nasal congestion: Secondary | ICD-10-CM | POA: Diagnosis not present

## 2022-08-27 DIAGNOSIS — R42 Dizziness and giddiness: Secondary | ICD-10-CM | POA: Insufficient documentation

## 2022-08-27 MED ORDER — PSEUDOEPHEDRINE HCL 60 MG PO TABS: 60.0000 mg | ORAL_TABLET | ORAL | 0 refills | Status: AC | PRN

## 2022-08-27 MED ORDER — MECLIZINE HCL 25 MG PO TABS: 25.0000 mg | ORAL_TABLET | Freq: Three times a day (TID) | ORAL | 0 refills | Status: AC | PRN

## 2022-08-27 MED ORDER — PSEUDOEPHEDRINE HCL 60 MG PO TABS: 60.0000 mg | ORAL_TABLET | ORAL | 0 refills | Status: DC | PRN

## 2022-08-27 MED ORDER — MECLIZINE HCL 25 MG PO TABS: 25.0000 mg | ORAL_TABLET | Freq: Three times a day (TID) | ORAL | 0 refills | Status: DC | PRN

## 2022-08-27 NOTE — Discharge Instructions (Signed)
Return for any problem.  ?

## 2022-08-27 NOTE — ED Provider Notes (Signed)
[place] EMERGENCY DEPARTMENT AT [facility] Provider Note   CSN: [id number] Arrival date & time: 08/27/22  1800     History  Chief Complaint  Patient presents with   Dental Pain   Dizziness    Micheal Garrison is a 32 y.o. male.  32 year old male with prior medical history as detailed below presents for evaluation.  Patient reports sinus congestion and intermittent dizziness times several weeks.  Patient reports that last week he finished a course of antibiotics for treatment of a tooth infection.  Patient reports that he was told by prior evaluator who prescribed antibiotics that his dizziness could be related to a dental infection.  He denies fever.  He denies headache.  He denies nausea or vomiting.  He reports intermittent dizziness that will last several seconds to several minutes at a time.  He reports mild sinus congestion and mild sinus pressure.  Pressure is worse when he leans his head forward.  He denies other complaint.  The history is provided by the patient and medical records.       Home Medications Prior to Admission medications   Medication Sig Start Date End Date Taking? Authorizing Provider  amoxicillin-clavulanate (AUGMENTIN) 875-125 MG tablet Take 1 tablet by mouth every 12 (twelve) hours. 07/26/22   Raspet, Noberto Retort, PA-C  ibuprofen (ADVIL) 800 MG tablet Take 1 tablet (800 mg total) by mouth 3 (three) times daily. 07/26/22   Raspet, Denny Peon K, PA-C  lidocaine (XYLOCAINE) 2 % solution Use as directed 15 mLs in the mouth or throat every 8 (eight) hours as needed for mouth pain. Swish and spit 07/26/22   Raspet, Noberto Retort, PA-C      Allergies    Patient has no known allergies.    Review of Systems   Review of Systems  All other systems reviewed and are negative.   Physical Exam Updated Vital Signs BP 132/85 (BP Location: Left Arm)   Pulse 81   Temp 98.2 F (36.8 C) (Oral)   Resp 17   Ht 6' (1.829 m)   Wt 127 kg   SpO2 100%   BMI 37.97 kg/m   Physical Exam Vitals and nursing note reviewed.  Constitutional:      General: He is not in acute distress.    Appearance: Normal appearance. He is well-developed.  HENT:     Head: Normocephalic and atraumatic.  Eyes:     Conjunctiva/sclera: Conjunctivae normal.     Pupils: Pupils are equal, round, and reactive to light.  Cardiovascular:     Rate and Rhythm: Normal rate and regular rhythm.     Heart sounds: Normal heart sounds.  Pulmonary:     Effort: Pulmonary effort is normal. No respiratory distress.     Breath sounds: Normal breath sounds.  Abdominal:     General: There is no distension.     Palpations: Abdomen is soft.     Tenderness: There is no abdominal tenderness.  Musculoskeletal:        General: No deformity. Normal range of motion.     Cervical back: Normal range of motion and neck supple.  Skin:    General: Skin is warm and dry.  Neurological:     General: No focal deficit present.     Mental Status: He is alert and oriented to person, place, and time. Mental status is at baseline.     Cranial Nerves: No cranial nerve deficit.     Sensory: No sensory deficit.  Motor: No weakness.     Coordination: Coordination normal.     ED Results / Procedures / Treatments   Labs (all labs ordered are listed, but only abnormal results are displayed) Labs Reviewed - No data to display  EKG None  Radiology No results found.  Procedures Procedures    Medications Ordered in ED Medications - No data to display  ED Course/ Medical Decision Making/ A&P                             Medical Decision Making   Medical Screen Complete  This patient presented to the ED with complaint of dizziness, sinus congestion.  This complaint involves an extensive number of treatment options. The initial differential diagnosis includes, but is not limited to, sinus congestion, benign positional vertigo, eustachian tube dysfunction, etc.  This presentation is: Chronic,  self-limited  Patient is present with complaint of intermittent dizziness associated with sinus congestion.  Symptoms are most consistent with eustachian tube dysfunction and perhaps mild positional vertigo.  History and exam do not suggest need for additional emergent workup.  Patient is prescribed Antivert and decongestant.  He is advised to follow-up closely with the PCP in the outpatient setting.  Strict return precautions given and understood.  Additional history obtained: External records from outside sources obtained and reviewed including prior ED visits and prior Inpatient records.   Problem List / ED Course:  Sinus congestion, transient vertigo   Reevaluation:  After the interventions noted above, I reevaluated the patient and found that they have: improved   Disposition:  After consideration of the diagnostic results and the patients response to treatment, I feel that the patent would benefit from close outpatient follow-up.          Final Clinical Impression(s) / ED Diagnoses Final diagnoses:  Sinus congestion    Rx / DC Orders ED Discharge Orders          Ordered    meclizine (ANTIVERT) 25 MG tablet  3 times daily PRN,   Status:  Discontinued        08/27/22 2035    pseudoephedrine (SUDAFED) 60 MG tablet  Every 4 hours PRN,   Status:  Discontinued        08/27/22 2035    meclizine (ANTIVERT) 25 MG tablet  3 times daily PRN        08/27/22 2116    pseudoephedrine (SUDAFED) 60 MG tablet  Every 4 hours PRN        08/27/22 2116              Wynetta Fines, MD 08/27/22 2121

## 2022-08-27 NOTE — ED Triage Notes (Signed)
Pt reports feeling dizzy x few days states was told it could be due to ear infection. Pt has been taking antibiotics for tooth infection.

## 2022-11-07 ENCOUNTER — Emergency Department (HOSPITAL_COMMUNITY)
Admission: EM | Admit: 2022-11-07 | Discharge: 2022-11-08 | Disposition: A | Discharge status: Home / Self Care | Payer: Medicaid Other | Attending: Emergency Medicine | Admitting: Emergency Medicine

## 2022-11-07 ENCOUNTER — Other Ambulatory Visit: Payer: Self-pay

## 2022-11-07 ENCOUNTER — Encounter (HOSPITAL_COMMUNITY): Payer: Self-pay | Admitting: Emergency Medicine

## 2022-11-07 ENCOUNTER — Emergency Department (HOSPITAL_COMMUNITY): Payer: Medicaid Other

## 2022-11-07 DIAGNOSIS — J45909 Unspecified asthma, uncomplicated: Secondary | ICD-10-CM | POA: Diagnosis not present

## 2022-11-07 DIAGNOSIS — R0789 Other chest pain: Secondary | ICD-10-CM | POA: Diagnosis not present

## 2022-11-07 DIAGNOSIS — R079 Chest pain, unspecified: Secondary | ICD-10-CM

## 2022-11-07 LAB — BASIC METABOLIC PANEL
Anion gap: 7 (ref 5–15)
BUN: 14 mg/dL (ref 6–20)
CO2: 27 mmol/L (ref 22–32)
Calcium: 9.1 mg/dL (ref 8.9–10.3)
Chloride: 105 mmol/L (ref 98–111)
Creatinine, Ser: 1.01 mg/dL (ref 0.61–1.24)
GFR, Estimated: >60 mL/min (ref >60)
Glucose, Bld: 112 mg/dL — ABNORMAL HIGH (ref 70–99)
Potassium: 4.4 mmol/L (ref 3.5–5.1)
Sodium: 139 mmol/L (ref 135–145)

## 2022-11-07 LAB — CBC
HCT: 41 % (ref 39.0–52.0)
Hemoglobin: 13.6 g/dL (ref 13.0–17.0)
MCH: 28.9 pg (ref 26.0–34.0)
MCHC: 33.2 g/dL (ref 30.0–36.0)
MCV: 87.2 fL (ref 80.0–100.0)
Platelets: 143 10*3/uL — ABNORMAL LOW (ref 150–400)
RBC: 4.7 MIL/uL (ref 4.22–5.81)
RDW: 12.5 % (ref 11.5–15.5)
WBC: 4.6 10*3/uL (ref 4.0–10.5)
nRBC: 0 % (ref 0.0–0.2)

## 2022-11-07 LAB — TROPONIN I (HIGH SENSITIVITY)
Troponin I (High Sensitivity): <2 ng/L (ref <18)
Troponin I (High Sensitivity): 3 ng/L (ref <18)

## 2022-11-07 NOTE — ED Triage Notes (Signed)
Patient BIB [facility] w/ intermittent chest pain midline x2 days sharp, sore to the touch with palpation. Denies any known traumas. Denies n.v.d  EMS vitals as follows: 124/78 HR 90 Spo2-99% on RA

## 2022-11-08 MED ORDER — FAMOTIDINE 20 MG PO TABS: 20.0000 mg | ORAL_TABLET | Freq: Two times a day (BID) | ORAL | 0 refills | Status: AC

## 2022-11-08 MED ORDER — PANTOPRAZOLE SODIUM 20 MG PO TBEC: 20.0000 mg | DELAYED_RELEASE_TABLET | Freq: Every day | ORAL | 0 refills | Status: AC

## 2022-11-08 NOTE — ED Provider Notes (Signed)
[place] EMERGENCY DEPARTMENT AT [facility] Provider Note   CSN: [id number] Arrival date & time: 11/07/22  2023     History  Chief Complaint  Patient presents with   Chest Pain    Micheal Garrison is a 32 y.o. male.  Patient with past medical history significant for asthma, GERD presents to the emergency department complaining of an intermittent chest pain felt just under the left pectoral which began yesterday afternoon while eating a bacon cheeseburger.  The patient states the pain is very intermittent in nature.  He denies any associated shortness of breath, nausea, vomiting, abdominal pain, radiation of symptoms.  Currently the patient is pain-free.  He states he starting medication for GERD but has not been taking medication due to lack of insurance.  He has an appointment in October to reestablish care with a primary care provider.   Chest Pain      Home Medications Prior to Admission medications   Medication Sig Start Date End Date Taking? Authorizing Provider  famotidine (PEPCID) 20 MG tablet Take 1 tablet (20 mg total) by mouth 2 (two) times daily. 11/08/22  Yes Darrick Grinder, PA-C  pantoprazole (PROTONIX) 20 MG tablet Take 1 tablet (20 mg total) by mouth daily. 11/08/22 12/08/22 Yes Darrick Grinder, PA-C  amoxicillin-clavulanate (AUGMENTIN) 875-125 MG tablet Take 1 tablet by mouth every 12 (twelve) hours. 07/26/22   Raspet, Noberto Retort, PA-C  ibuprofen (ADVIL) 800 MG tablet Take 1 tablet (800 mg total) by mouth 3 (three) times daily. 07/26/22   Raspet, Denny Peon K, PA-C  lidocaine (XYLOCAINE) 2 % solution Use as directed 15 mLs in the mouth or throat every 8 (eight) hours as needed for mouth pain. Swish and spit 07/26/22   Raspet, Noberto Retort, PA-C  meclizine (ANTIVERT) 25 MG tablet Take 1 tablet (25 mg total) by mouth 3 (three) times daily as needed for dizziness. 08/27/22   Wynetta Fines, MD  pseudoephedrine (SUDAFED) 60 MG tablet Take 1 tablet (60 mg total) by mouth  every 4 (four) hours as needed for congestion. 08/27/22   Wynetta Fines, MD      Allergies    Patient has no known allergies.    Review of Systems   Review of Systems  Cardiovascular:  Positive for chest pain.    Physical Exam Updated Vital Signs BP 134/77 (BP Location: Right Arm)   Pulse 77   Temp 98.3 F (36.8 C) (Oral)   Resp 18   Ht 6' (1.829 m)   Wt 127 kg   SpO2 100%   BMI 37.97 kg/m  Physical Exam Vitals and nursing note reviewed.  Constitutional:      General: He is not in acute distress.    Appearance: He is well-developed.  HENT:     Head: Normocephalic and atraumatic.  Eyes:     Conjunctiva/sclera: Conjunctivae normal.  Cardiovascular:     Rate and Rhythm: Normal rate and regular rhythm.  Pulmonary:     Effort: Pulmonary effort is normal. No respiratory distress.     Breath sounds: Normal breath sounds.  Chest:     Chest wall: Tenderness present.     Comments: Tenderness to palpation under left pectoral region Abdominal:     Palpations: Abdomen is soft.     Tenderness: There is no abdominal tenderness.  Musculoskeletal:        General: No swelling.     Cervical back: Neck supple.  Skin:    General: Skin is  warm and dry.     Capillary Refill: Capillary refill takes less than 2 seconds.  Neurological:     Mental Status: He is alert.  Psychiatric:        Mood and Affect: Mood normal.     ED Results / Procedures / Treatments   Labs (all labs ordered are listed, but only abnormal results are displayed) Labs Reviewed  BASIC METABOLIC PANEL - Abnormal; Notable for the following components:      Result Value   Glucose, Bld 112 (*)    All other components within normal limits  CBC - Abnormal; Notable for the following components:   Platelets 143 (*)    All other components within normal limits  TROPONIN I (HIGH SENSITIVITY)  TROPONIN I (HIGH SENSITIVITY)    EKG None  Radiology DG Chest 2 View  Result Date: 11/07/2022 CLINICAL DATA:   Chest pain EXAM: CHEST - 2 VIEW COMPARISON:  11/02/2014 FINDINGS: The heart size and mediastinal contours are within normal limits. Both lungs are clear. The visualized skeletal structures are unremarkable. IMPRESSION: No active cardiopulmonary disease. Electronically Signed   By: Jasmine Pang M.D.   On: 11/07/2022 22:53    Procedures Procedures    Medications Ordered in ED Medications - No data to display  ED Course/ Medical Decision Making/ A&P Clinical Course as of 11/08/22 0116  Thu Nov 07, 2022  2232 Troponin I (High Sensitivity) [RT]    Clinical Course User Index [RT] Carlean Purl D                                 Medical Decision Making Amount and/or Complexity of Data Reviewed Labs: ordered. Radiology: ordered.  Risk Prescription drug management.   This patient presents to the ED for concern of chest pain, this involves an extensive number of treatment options, and is a complaint that carries with it a high risk of complications and morbidity.  The differential diagnosis includes musculoskeletal pain, ACS, pneumonia, PE, anxiety, GERD, others   Co morbidities that complicate the patient evaluation  History of GERD   Additional history obtained:  Additional history obtained from EMS   Lab Tests:  I Ordered, and personally interpreted labs.  The pertinent results include:  Initial troponin <2, repeat 3; unremarkable BMP, CBC   Imaging Studies ordered:  I ordered imaging studies including chest x-ray  I independently visualized and interpreted imaging which showed no acute findings I agree with the radiologist interpretation   Cardiac Monitoring: / EKG:  The patient was maintained on a cardiac monitor.  I personally viewed and interpreted the cardiac monitored which showed an underlying rhythm of: sinus rhythm with sinus arrhythmia   Social Determinants of Health:  Patient has medicaid for his primary insurance type   Test / Admission -  Considered:  Patient with negative troponins x 2, nonischemic EKG. No signs of ACS. No shortness of breath or tachycardia to suggest PE. No pneumonia on chest x-ray. The chest wall is mildly tender to palpation.  Question MSK tenderness vs GERD. No signs of life threatening condition at this time. Patient stable for discharge home. Prescribed protonix and pepcid for possible GERD, will have patient keep upcoming appointment with primary care. Strict return precautions provided including shortness of breath.          Final Clinical Impression(s) / ED Diagnoses Final diagnoses:  None    Rx / DC Orders ED Discharge  Orders          Ordered    pantoprazole (PROTONIX) 20 MG tablet  Daily        11/08/22 0048    famotidine (PEPCID) 20 MG tablet  2 times daily        11/08/22 0048              Pamala Duffel 11/08/22 0116    Tilden Fossa, MD 11/08/22 [phone]

## 2022-11-08 NOTE — Discharge Instructions (Signed)
You were seen tonight for chest pain. Your workup was reassuring. I have prescribed medications for your GERD. Please keep your upcoming appointment with primary care. If you develop any life threatening symptoms such as new chest pain or shortness of breath please return to the emergency department.

## 2022-12-08 ENCOUNTER — Emergency Department (HOSPITAL_COMMUNITY): Payer: Medicaid Other

## 2022-12-08 ENCOUNTER — Emergency Department (HOSPITAL_COMMUNITY)
Admission: EM | Admit: 2022-12-08 | Discharge: 2022-12-09 | Disposition: A | Discharge status: Home / Self Care | Payer: Medicaid Other | Attending: Emergency Medicine | Admitting: Emergency Medicine

## 2022-12-08 ENCOUNTER — Other Ambulatory Visit: Payer: Self-pay

## 2022-12-08 DIAGNOSIS — R7401 Elevation of levels of liver transaminase levels: Secondary | ICD-10-CM | POA: Insufficient documentation

## 2022-12-08 DIAGNOSIS — F17200 Nicotine dependence, unspecified, uncomplicated: Secondary | ICD-10-CM | POA: Diagnosis not present

## 2022-12-08 DIAGNOSIS — R000 Tachycardia, unspecified: Secondary | ICD-10-CM | POA: Diagnosis not present

## 2022-12-08 DIAGNOSIS — J45909 Unspecified asthma, uncomplicated: Secondary | ICD-10-CM | POA: Insufficient documentation

## 2022-12-08 DIAGNOSIS — Z1152 Encounter for screening for COVID-19: Secondary | ICD-10-CM | POA: Insufficient documentation

## 2022-12-08 DIAGNOSIS — R002 Palpitations: Secondary | ICD-10-CM | POA: Insufficient documentation

## 2022-12-08 DIAGNOSIS — J329 Chronic sinusitis, unspecified: Secondary | ICD-10-CM | POA: Insufficient documentation

## 2022-12-08 DIAGNOSIS — R0602 Shortness of breath: Secondary | ICD-10-CM | POA: Diagnosis present

## 2022-12-08 LAB — CBC
HCT: 41.7 % (ref 39.0–52.0)
Hemoglobin: 14.1 g/dL (ref 13.0–17.0)
MCH: 28.5 pg (ref 26.0–34.0)
MCHC: 33.8 g/dL (ref 30.0–36.0)
MCV: 84.4 fL (ref 80.0–100.0)
Platelets: 159 10*3/uL (ref 150–400)
RBC: 4.94 MIL/uL (ref 4.22–5.81)
RDW: 12.7 % (ref 11.5–15.5)
WBC: 6.8 10*3/uL (ref 4.0–10.5)
nRBC: 0 % (ref 0.0–0.2)

## 2022-12-08 NOTE — ED Triage Notes (Signed)
Pt c/o palpitations and tachycardia and dizziness. Started while pt was sitting down after he ate. SOB is intermittent. Dizziness and tachycardia are intermittent as well. Pt denies CP

## 2022-12-09 ENCOUNTER — Encounter (HOSPITAL_COMMUNITY): Payer: Self-pay

## 2022-12-09 LAB — COMPREHENSIVE METABOLIC PANEL
ALT: 29 U/L (ref 0–44)
AST: 45 U/L — ABNORMAL HIGH (ref 15–41)
Albumin: 3.9 g/dL (ref 3.5–5.0)
Alkaline Phosphatase: 67 U/L (ref 38–126)
Anion gap: 7 (ref 5–15)
BUN: 14 mg/dL (ref 6–20)
CO2: 23 mmol/L (ref 22–32)
Calcium: 8.4 mg/dL — ABNORMAL LOW (ref 8.9–10.3)
Chloride: 104 mmol/L (ref 98–111)
Creatinine, Ser: 0.82 mg/dL (ref 0.61–1.24)
GFR, Estimated: >60 mL/min (ref >60)
Glucose, Bld: 76 mg/dL (ref 70–99)
Potassium: 4.7 mmol/L (ref 3.5–5.1)
Sodium: 134 mmol/L — ABNORMAL LOW (ref 135–145)
Total Bilirubin: 1.4 mg/dL — ABNORMAL HIGH (ref 0.3–1.2)

## 2022-12-09 LAB — RESP PANEL BY RT-PCR (RSV, FLU A&B, COVID)  RVPGX2
Influenza A by PCR: NEGATIVE
Influenza B by PCR: NEGATIVE
Resp Syncytial Virus by PCR: NEGATIVE
SARS Coronavirus 2 by RT PCR: NEGATIVE

## 2022-12-09 MED ORDER — ALBUTEROL SULFATE HFA 108 (90 BASE) MCG/ACT IN AERS: 1.0000 | INHALATION_SPRAY | Freq: Four times a day (QID) | RESPIRATORY_TRACT | 0 refills | Status: AC | PRN

## 2022-12-09 MED ORDER — PREDNISONE 10 MG PO TABS
40.0000 mg | ORAL_TABLET | Freq: Every day | ORAL | 0 refills | Status: AC
Start: 2022-12-09 — End: 2022-12-14

## 2022-12-09 MED ORDER — DOXYCYCLINE HYCLATE 100 MG PO CAPS: 100.0000 mg | ORAL_CAPSULE | Freq: Two times a day (BID) | ORAL | 0 refills | Status: AC

## 2022-12-09 NOTE — ED Provider Notes (Signed)
[place] EMERGENCY DEPARTMENT AT [facility] Provider Note   CSN: [id number] Arrival date & time: 12/08/22  2318     History  Chief Complaint  Patient presents with   Shortness of Breath   Palpitations    Micheal Garrison is a 32 y.o. male.  32 year old male with complaint of SHOB and palpitations after eating [place] food tonight. With mild wheezing, hx of asthma, daily smoker, out of albuterol. Also feeling dizzy described as congestion x 2-3 months. Patient took Augmentin for a dental infection and Sudafed, no improvement in his sinus congestion. Denies fevers, chills, nausea, vomiting, abdominal pain. SHOB and palpitations improved since onset.        Home Medications Prior to Admission medications   Medication Sig Start Date End Date Taking? Authorizing Provider  albuterol (VENTOLIN HFA) 108 (90 Base) MCG/ACT inhaler Inhale 1-2 puffs into the lungs every 6 (six) hours as needed for wheezing or shortness of breath. 12/09/22  Yes Jeannie Fend, PA-C  doxycycline (VIBRAMYCIN) 100 MG capsule Take 1 capsule (100 mg total) by mouth 2 (two) times daily. 12/09/22  Yes Jeannie Fend, PA-C  predniSONE (DELTASONE) 10 MG tablet Take 4 tablets (40 mg total) by mouth daily for 5 days. 12/09/22 12/14/22 Yes Jeannie Fend, PA-C  famotidine (PEPCID) 20 MG tablet Take 1 tablet (20 mg total) by mouth 2 (two) times daily. 11/08/22   Darrick Grinder, PA-C  ibuprofen (ADVIL) 800 MG tablet Take 1 tablet (800 mg total) by mouth 3 (three) times daily. 07/26/22   Raspet, Denny Peon K, PA-C  lidocaine (XYLOCAINE) 2 % solution Use as directed 15 mLs in the mouth or throat every 8 (eight) hours as needed for mouth pain. Swish and spit 07/26/22   Raspet, Noberto Retort, PA-C  meclizine (ANTIVERT) 25 MG tablet Take 1 tablet (25 mg total) by mouth 3 (three) times daily as needed for dizziness. 08/27/22   Wynetta Fines, MD  pantoprazole (PROTONIX) 20 MG tablet Take 1 tablet (20 mg total) by mouth daily.  11/08/22 12/08/22  Darrick Grinder, PA-C  pseudoephedrine (SUDAFED) 60 MG tablet Take 1 tablet (60 mg total) by mouth every 4 (four) hours as needed for congestion. 08/27/22   Wynetta Fines, MD      Allergies    Patient has no known allergies.    Review of Systems   Review of Systems Negative except as per HPI Physical Exam Updated Vital Signs BP 124/81 (BP Location: Left Arm)   Pulse 82   Temp 97.9 F (36.6 C) (Oral)   Resp 20   Ht 6' (1.829 m)   Wt 127 kg   SpO2 98%   BMI 37.97 kg/m  Physical Exam Vitals and nursing note reviewed.  Constitutional:      General: He is not in acute distress.    Appearance: He is well-developed. He is not diaphoretic.  HENT:     Head: Normocephalic and atraumatic.     Right Ear: Tympanic membrane normal.     Left Ear: Tympanic membrane normal.     Nose:     Right Turbinates: Swollen. Not pale.     Left Turbinates: Swollen. Not pale.     Right Sinus: No maxillary sinus tenderness or frontal sinus tenderness.     Left Sinus: No maxillary sinus tenderness or frontal sinus tenderness.     Mouth/Throat:     Mouth: Mucous membranes are moist.     Pharynx: Oropharynx is  clear. Uvula midline. No oropharyngeal exudate, posterior oropharyngeal erythema, uvula swelling or postnasal drip.  Cardiovascular:     Rate and Rhythm: Normal rate and regular rhythm.  Pulmonary:     Effort: Pulmonary effort is normal.     Breath sounds: Normal breath sounds.  Chest:     Chest wall: No tenderness.  Musculoskeletal:     Cervical back: Neck supple.     Right lower leg: No tenderness. No edema.     Left lower leg: No tenderness. No edema.  Skin:    General: Skin is warm and dry.  Neurological:     Mental Status: He is alert and oriented to person, place, and time.  Psychiatric:        Behavior: Behavior normal.     ED Results / Procedures / Treatments   Labs (all labs ordered are listed, but only abnormal results are displayed) Labs Reviewed   COMPREHENSIVE METABOLIC PANEL - Abnormal; Notable for the following components:      Result Value   Sodium 134 (*)    Calcium 8.4 (*)    AST 45 (*)    Total Bilirubin 1.4 (*)    All other components within normal limits  RESP PANEL BY RT-PCR (RSV, FLU A&B, COVID)  [id number]  CBC    EKG EKG Interpretation Date/Time:  Sunday December 08 2022 23:26:57 EDT Ventricular Rate:  106 PR Interval:  202 QRS Duration:  90 QT Interval:  340 QTC Calculation: 451 R Axis:   67  Text Interpretation: Sinus tachycardia Nonspecific ST abnormality Abnormal ECG When compared with ECG of 07-Nov-2022 20:27, PREVIOUS ECG IS PRESENT Confirmed by Drema Pry [phone]) on 12/09/2022 2:06:14 AM  Radiology DG Chest Port 1 View  Result Date: 12/08/2022 CLINICAL DATA:  Short of breath, palpitations EXAM: PORTABLE CHEST 1 VIEW COMPARISON:  11/07/2022 FINDINGS: The heart size and mediastinal contours are within normal limits. Both lungs are clear. The visualized skeletal structures are unremarkable. IMPRESSION: No active disease. Electronically Signed   By: Sharlet Salina M.D.   On: 12/08/2022 23:48    Procedures Procedures    Medications Ordered in ED Medications - No data to display  ED Course/ Medical Decision Making/ A&P                                 Medical Decision Making Amount and/or Complexity of Data Reviewed Labs: ordered. Radiology: ordered.  Risk Prescription drug management.   This patient presents to the ED for concern of wheezing and head congestion, this involves an extensive number of treatment options, and is a complaint that carries with it a high risk of complications and morbidity.  The differential diagnosis includes but not limited to asthma exacerbation, GERD, sinus infection, URI   Co morbidities that complicate the patient evaluation  Asthma   Additional history obtained:  External records from outside source obtained and reviewed including prior labs on file for  comparison   Lab Tests:  I Ordered, and personally interpreted labs.  The pertinent results include: CBC within normal notes.  Viral panel negative.  CMP with mildly elevated AST at 45 and bili at 1.4 with normal alk phos.   Imaging Studies ordered:  I ordered imaging studies including chest x-ray I independently visualized and interpreted imaging which showed no acute process I agree with the radiologist interpretation   Cardiac Monitoring: / EKG:  The patient was maintained on a cardiac  monitor.  I personally viewed and interpreted the cardiac monitored which showed an underlying rhythm of: Sinus tachycardia, rate 106   Problem List / ED Course / Critical interventions / Medication management  32 year old male with wheezing, shortness of breath and palpitations after eating [place] food tonight.  Also reports sinus congestion.  Exam is unremarkable, lungs are clear to auscultation.  States he is out of his inhaler.  Chest x-ray unremarkable, labs without significant findings.  Provided with refill of his inhaler and prednisone.  Advised if prednisone does not help with his sinus congestion which has been present since June and did not improve with Augmentin, then he can take the doxycycline as prescribed.  Provided with referral to ENT if symptoms do not improve. I have reviewed the patients home medicines and have made adjustments as needed   Social Determinants of Health:  Lives with family, no PCP on file   Test / Admission - Considered:  Symptoms improved, plan for discharge to follow-up with [place] and wellness and return to ER as needed.         Final Clinical Impression(s) / ED Diagnoses Final diagnoses:  Palpitations  Chronic sinusitis, unspecified location    Rx / DC Orders ED Discharge Orders          Ordered    albuterol (VENTOLIN HFA) 108 (90 Base) MCG/ACT inhaler  Every 6 hours PRN        12/09/22 0425    predniSONE (DELTASONE) 10 MG tablet   Daily        12/09/22 0425    doxycycline (VIBRAMYCIN) 100 MG capsule  2 times daily        12/09/22 0425              Jeannie Fend, PA-C 12/09/22 0434    Nira Conn, MD 12/09/22 [phone]

## 2022-12-09 NOTE — Discharge Instructions (Signed)
Take his own as prescribed and complete the full course.  If your sinus symptoms are not improving with the prednisone, then take the doxycycline.  Follow-up with ENT if your symptoms do not improve.  Please follow-up with your primary care provider, you can recheck with [place] and wellness if you do not have a PCP.

## 2023-01-27 DIAGNOSIS — Z5321 Procedure and treatment not carried out due to patient leaving prior to being seen by health care provider: Secondary | ICD-10-CM | POA: Diagnosis not present

## 2023-01-27 DIAGNOSIS — R002 Palpitations: Secondary | ICD-10-CM | POA: Insufficient documentation

## 2023-01-28 ENCOUNTER — Encounter (HOSPITAL_COMMUNITY): Payer: Self-pay

## 2023-01-28 ENCOUNTER — Other Ambulatory Visit: Payer: Self-pay

## 2023-01-28 ENCOUNTER — Emergency Department (HOSPITAL_COMMUNITY): Payer: Medicaid Other

## 2023-01-28 ENCOUNTER — Emergency Department (HOSPITAL_COMMUNITY)
Admission: EM | Admit: 2023-01-28 | Discharge: 2023-01-28 | Discharge status: Against Medical Advice | Payer: Medicaid Other | Attending: Emergency Medicine | Admitting: Emergency Medicine

## 2023-01-28 LAB — BASIC METABOLIC PANEL
Anion gap: 12 (ref 5–15)
BUN: 12 mg/dL (ref 6–20)
CO2: 20 mmol/L — ABNORMAL LOW (ref 22–32)
Calcium: 9.3 mg/dL (ref 8.9–10.3)
Chloride: 105 mmol/L (ref 98–111)
Creatinine, Ser: 1.1 mg/dL (ref 0.61–1.24)
GFR, Estimated: >60 mL/min (ref >60)
Glucose, Bld: 119 mg/dL — ABNORMAL HIGH (ref 70–99)
Potassium: 3.5 mmol/L (ref 3.5–5.1)
Sodium: 137 mmol/L (ref 135–145)

## 2023-01-28 LAB — CBC
HCT: 45.3 % (ref 39.0–52.0)
Hemoglobin: 15.3 g/dL (ref 13.0–17.0)
MCH: 28.5 pg (ref 26.0–34.0)
MCHC: 33.8 g/dL (ref 30.0–36.0)
MCV: 84.5 fL (ref 80.0–100.0)
Platelets: 225 10*3/uL (ref 150–400)
RBC: 5.36 MIL/uL (ref 4.22–5.81)
RDW: 12.4 % (ref 11.5–15.5)
WBC: 6.5 10*3/uL (ref 4.0–10.5)
nRBC: 0 % (ref 0.0–0.2)

## 2023-01-28 LAB — TROPONIN I (HIGH SENSITIVITY): Troponin I (High Sensitivity): 6 ng/L (ref <18)

## 2023-01-28 NOTE — ED Triage Notes (Signed)
Pt arrived POV d/t feeling palpitations - he states he has been stressed about his health.

## 2023-01-28 NOTE — ED Notes (Signed)
Pt called several times for vitals, no response.

## 2023-07-20 ENCOUNTER — Other Ambulatory Visit: Payer: Self-pay | Admitting: Medical Genetics

## 2023-09-08 ENCOUNTER — Ambulatory Visit: Admitting: Orthopaedic Surgery

## 2023-12-03 ENCOUNTER — Other Ambulatory Visit: Payer: Self-pay | Admitting: Medical Genetics

## 2023-12-03 DIAGNOSIS — Z006 Encounter for examination for normal comparison and control in clinical research program: Secondary | ICD-10-CM

## 2023-12-15 ENCOUNTER — Encounter: Payer: Self-pay | Admitting: Radiology
